# Patient Record
Sex: Male | Born: 1990 | Race: White | Hispanic: No | Marital: Single | Smoking: Current every day smoker
Health system: Southern US, Community
[De-identification: ages and names within clinical notes are randomized; demographics above are authoritative.]

## PROBLEM LIST (undated history)

## (undated) DIAGNOSIS — F329 Major depressive disorder, single episode, unspecified: Secondary | ICD-10-CM

## (undated) DIAGNOSIS — R569 Unspecified convulsions: Secondary | ICD-10-CM

## (undated) DIAGNOSIS — F32A Depression, unspecified: Secondary | ICD-10-CM

## (undated) HISTORY — PX: WISDOM TOOTH EXTRACTION: SHX21

---
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
## 2011-09-14 ENCOUNTER — Encounter: Payer: Self-pay | Admitting: *Deleted

## 2011-09-14 ENCOUNTER — Emergency Department (HOSPITAL_COMMUNITY)
Admission: EM | Admit: 2011-09-14 | Discharge: 2011-09-14 | Disposition: A | Discharge status: Home / Self Care | Payer: PPO | Attending: Emergency Medicine | Admitting: Emergency Medicine

## 2011-09-14 DIAGNOSIS — F3289 Other specified depressive episodes: Secondary | ICD-10-CM | POA: Insufficient documentation

## 2011-09-14 DIAGNOSIS — F329 Major depressive disorder, single episode, unspecified: Secondary | ICD-10-CM | POA: Insufficient documentation

## 2011-09-14 DIAGNOSIS — R45851 Suicidal ideations: Secondary | ICD-10-CM | POA: Insufficient documentation

## 2011-09-14 HISTORY — DX: Major depressive disorder, single episode, unspecified: F32.9

## 2011-09-14 HISTORY — DX: Depression, unspecified: F32.A

## 2011-09-14 LAB — COMPREHENSIVE METABOLIC PANEL
ALT: 11 U/L (ref 0–53)
AST: 21 U/L (ref 0–37)
Albumin: 4.2 g/dL (ref 3.5–5.2)
Alkaline Phosphatase: 146 U/L — ABNORMAL HIGH (ref 39–117)
BUN: 8 mg/dL (ref 6–23)
CO2: 29 mEq/L (ref 19–32)
Calcium: 9.9 mg/dL (ref 8.4–10.5)
Chloride: 102 mEq/L (ref 96–112)
Creatinine, Ser: 1.07 mg/dL (ref 0.50–1.35)
GFR calc Af Amer: >90 mL/min (ref >90)
GFR calc non Af Amer: >90 mL/min (ref >90)
Glucose, Bld: 85 mg/dL (ref 70–99)
Potassium: 3.6 mEq/L (ref 3.5–5.1)
Sodium: 138 mEq/L (ref 135–145)
Total Bilirubin: 0.4 mg/dL (ref 0.3–1.2)
Total Protein: 6.9 g/dL (ref 6.0–8.3)

## 2011-09-14 LAB — CBC
HCT: 42.8 % (ref 39.0–52.0)
Hemoglobin: 14.8 g/dL (ref 13.0–17.0)
MCH: 31.2 pg (ref 26.0–34.0)
MCHC: 34.6 g/dL (ref 30.0–36.0)
MCV: 90.1 fL (ref 78.0–100.0)
Platelets: 250 10*3/uL (ref 150–400)
RBC: 4.75 MIL/uL (ref 4.22–5.81)
RDW: 12.8 % (ref 11.5–15.5)
WBC: 6.1 10*3/uL (ref 4.0–10.5)

## 2011-09-14 LAB — RAPID URINE DRUG SCREEN, HOSP PERFORMED
Amphetamines: NOT DETECTED
Barbiturates: NOT DETECTED
Benzodiazepines: NOT DETECTED
Cocaine: NOT DETECTED
Opiates: NOT DETECTED
Tetrahydrocannabinol: POSITIVE — AB

## 2011-09-14 LAB — ACETAMINOPHEN LEVEL: Acetaminophen (Tylenol), Serum: <15 ug/mL (ref 10–30)

## 2011-09-14 LAB — ETHANOL: Alcohol, Ethyl (B): <11 mg/dL (ref 0–11)

## 2011-09-14 MED ORDER — ONDANSETRON HCL 4 MG PO TABS: 4.0000 mg | ORAL_TABLET | Freq: Three times a day (TID) | ORAL | Status: DC | PRN

## 2011-09-14 MED ORDER — ZIPRASIDONE HCL 20 MG PO CAPS
20.0000 mg | ORAL_CAPSULE | Freq: Once | ORAL | Status: AC
  Administered 2011-09-14: 20 mg via ORAL
  Filled 2011-09-14: qty 1

## 2011-09-14 MED ORDER — NICOTINE 21 MG/24HR TD PT24: 21.0000 mg | MEDICATED_PATCH | Freq: Every day | TRANSDERMAL | Status: DC

## 2011-09-14 MED ORDER — FLUOXETINE HCL 20 MG PO TABS: 30.0000 mg | ORAL_TABLET | Freq: Every day | ORAL | Status: DC

## 2011-09-14 MED ORDER — ACETAMINOPHEN 325 MG PO TABS: 650.0000 mg | ORAL_TABLET | ORAL | Status: DC | PRN

## 2011-09-14 MED ORDER — LORAZEPAM 1 MG PO TABS
1.0000 mg | ORAL_TABLET | Freq: Three times a day (TID) | ORAL | Status: DC | PRN
  Administered 2011-09-14: 1 mg via ORAL
  Filled 2011-09-14: qty 1

## 2011-09-14 NOTE — ED Notes (Signed)
Pt has completed telepsych consult which has recommended discharge home with medication changes. Psychiatrist, Dr. Rob Bunting, has recommended discontinuing Cymbalta, keeping Klonopin at 0.5 BID and starting back Prozac at 30mg  qd. EDP has been notified and is in agreement in decision to d/c. RN made aware. Pt was able to contract for safety and states he will be d/c home to his mother/aunt's house. Pt was given referrals to [facility] and [facility]. Pt agrees to follow up with the psychiatrist appointment scheduled for 09/17/11 with Dr. Clearance Coots and continue seeing his counselor at [facility] No further needs identified at this time.

## 2011-09-14 NOTE — ED Provider Notes (Signed)
History     CSN: [id number] Arrival date & time: 09/14/2011  4:44 PM   First MD Initiated Contact with Patient 09/14/11 1745      Chief Complaint  Patient presents with  . Suicidal    pt reports hx of severe depression, has plan to attempt suicide. pt has appt with psychiatrist on monday but was told to come to ER due to SI. Denies HI.    (Consider location/radiation/quality/duration/timing/severity/associated sxs/prior treatment) HPI Comments: Patient presents with depression and suicidal ideation has been going on for the past few weeks. He takes Prozac and was started on Cymbalta and Klonopin 2 weeks ago by an urgent care. He is not yet seen a psychiatrist but is going to see one on December 3. He says he has multiple plan to kill himself. He denies any homicidal ideation or hallucinations. He states he is not talking alcohol since none is present medications. Denies any somatic complaints.  The history is provided by the patient and a relative.    Past Medical History  Diagnosis Date  . Depression     History reviewed. No pertinent past surgical history.  History reviewed. No pertinent family history.  History  Substance Use Topics  . Smoking status: Never Smoker   . Smokeless tobacco: Not on file  . Alcohol Use: No      Review of Systems  All other systems reviewed and are negative.    Allergies  Review of patient's allergies indicates no known allergies.  Home Medications   Current Outpatient Rx  Name Route Sig Dispense Refill  . CLONAZEPAM 0.5 MG PO TABS Oral Take 0.5 mg by mouth 3 (three) times daily as needed. For anxiety.     . DULOXETINE HCL 30 MG PO CPEP Oral Take 30 mg by mouth 2 (two) times daily.      . IBUPROFEN 200 MG PO TABS Oral Take 600-800 mg by mouth every 8 (eight) hours as needed. For pain.     Marland Kitchen VITAMIN B-12 1000 MCG PO TABS Oral Take 1,000 mcg by mouth daily.      Marland Kitchen FLUOXETINE HCL 20 MG PO TABS Oral Take 1.5 tablets (30 mg total) by  mouth daily. 20 tablet 0    BP 111/66  Pulse 82  Temp(Src) 98.2 F (36.8 C) (Oral)  Resp 20  Wt 150 lb (68.04 kg)  SpO2 98%  Physical Exam  Constitutional: He is oriented to person, place, and time. He appears well-developed and well-nourished. No distress.  HENT:  Head: Normocephalic and atraumatic.  Mouth/Throat: Oropharynx is clear and moist. No oropharyngeal exudate.  Eyes: Conjunctivae are normal. Pupils are equal, round, and reactive to light.  Neck: Normal range of motion.  Cardiovascular: Normal rate, regular rhythm and normal heart sounds.   Pulmonary/Chest: Effort normal and breath sounds normal. No respiratory distress.  Abdominal: Soft. There is no tenderness. There is no rebound and no guarding.  Musculoskeletal: Normal range of motion. He exhibits no edema and no tenderness.  Neurological: He is alert and oriented to person, place, and time. No cranial nerve deficit.  Skin: Skin is warm.  Psychiatric: He is withdrawn. He exhibits a depressed mood.    ED Course  Procedures (including critical care time)  Labs Reviewed  URINE RAPID DRUG SCREEN (HOSP PERFORMED) - Abnormal; Notable for the following:    Tetrahydrocannabinol POSITIVE (*)    All other components within normal limits  COMPREHENSIVE METABOLIC PANEL - Abnormal; Notable for the following:  Alkaline Phosphatase 146 (*)    All other components within normal limits  CBC  ETHANOL  ACETAMINOPHEN LEVEL   No results found.   1. Depression       MDM  Suicidal ideation without plan. Hemodynamically stable without somatic complaints. We'll check screening labs and discuss with act team.  Patient was seen and evaluated by ACT team as well as telepsychiatry.  Found to be depressed but not actively suicidal.  Discontinuation of cymbalta recommended.  Has psychiatrist appointment on Monday.   Mood and affect are much improved on reassessment. Patient states he is no longer suicidal or homicidal.  He  agrees to followup with his psychiatrist as scheduled.  Glynn Octave, MD 09/15/11 [phone]

## 2011-09-14 NOTE — BH Assessment (Addendum)
Assessment Note   Stephen [name] is an 20 y.o. male. Pt reported to the [facility] with a chief complaint of SI with "muliple plans" one in which to [place]. Pt states that he has had increased depression for the past three weeks, triggered by judiciary issues at school and a recent change to his medication. Pt states that drugs were found in his room,"that they were not his" but he had a hearing on campus due to the discovery. Pt reports that he had been taking Prozac prescribed by his [employer] for 3 months and then the medications were recently changed at an urgent care. Pt states he was taken off Prozac and then prescribed Cymbalta and Klonopin which he has taken for the past 3 weeks. Pt reports having increased SI since the medication change as well as an increase sleeping and a decrease in apitite. Pt reports having a decrease in concentration and has been isolating himself from others, not attending class. Pt now states that if he was off the medications he feels the SI and other symptoms would subside. Pt has an outpatient psychiatric appointment scheduled for 09/17/11, though he was concerned that he would not be able to control thoughts of SI if not seen before then. Pt now currently denying any intention to act on SI. Denies HI/AVH. Pt is currently pending a tele-psych for disposition recommendations.   Axis I: Major Depression, single episode Axis II: Deferred Axis III:  Past Medical History  Diagnosis Date  . Depression    Axis IV: educational problems and other psychosocial or environmental problems Axis V: 41-50 serious symptoms  Past Medical History:  Past Medical History  Diagnosis Date  . Depression     History reviewed. No pertinent past surgical history.  Family History: History reviewed. No pertinent family history.  Social History:  reports that he has never smoked. He does not have any smokeless tobacco history on  file. He reports that he uses illicit drugs (Marijuana). He reports that he does not drink alcohol.  Allergies: No Known Allergies  Home Medications:  Medications Prior to Admission  Medication Dose Route Frequency Provider Last Rate Last Dose  . acetaminophen (TYLENOL) tablet 650 mg  650 mg Oral Q4H PRN Glynn Octave, MD      . LORazepam (ATIVAN) tablet 1 mg  1 mg Oral Q8H PRN Glynn Octave, MD   1 mg at 09/14/11 1911  . nicotine (NICODERM CQ - dosed in mg/24 hours) patch 21 mg  21 mg Transdermal Daily Glynn Octave, MD      . ondansetron [facility]) tablet 4 mg  4 mg Oral Q8H PRN Glynn Octave, MD      . ziprasidone (GEODON) capsule 20 mg  20 mg Oral Once Glynn Octave, MD   20 mg at 09/14/11 1947   No current outpatient prescriptions on file as of 09/14/2011.    OB/GYN Status:  No LMP for male patient.  General Assessment Data Assessment Number: 1  Living Arrangements: Friends ([place] college [place] with a roommate) Can pt return to current living arrangement?: Yes Admission Status: Voluntary Is patient capable of signing voluntary admission?: Yes Transfer from: Acute Hospital Referral Source: Self/Family/Friend  Risk to self Suicidal Ideation: Yes-Currently Present (Pt reports SI gestures at age 49) Suicidal Intent: No Is patient at risk for suicide?: No Suicidal Plan?: No Access to Means: No What has been your use of drugs/alcohol within the  last 12 months?: Marijuana- 1/8 oz weekly for years Other Self Harm Risks: none Triggers for Past Attempts: Other (Comment) (depression has triggered SI in past) Intentional Self Injurious Behavior: None Factors that decrease suicide risk: Other deterrents (comment) (Pt states THC is his only deterrent) Family Suicide History: No Recent stressful life event(s): Legal Issues;Other (Comment) (legal issue at school related to drugs found in dorm room) Persecutory voices/beliefs?: No Depression: Yes Depression  Symptoms: Insomnia;Tearfulness;Isolating;Loss of interest in usual pleasures;Feeling worthless/self pity Substance abuse history and/or treatment for substance abuse?: Yes Suicide prevention information given to non-admitted patients: Yes  Risk to Others Homicidal Ideation: No Thoughts of Harm to Others: No Current Homicidal Intent: No Current Homicidal Plan: No Access to Homicidal Means: No History of harm to others?: No Assessment of Violence: None Noted Does patient have access to weapons?: No Criminal Charges Pending?: No Does patient have a court date: No  Mental Status Report Appear/Hygiene: Disheveled Eye Contact: Fair Motor Activity: Unremarkable Speech: Logical/coherent Level of Consciousness: Drowsy Mood: Depressed;Anxious Affect: Depressed Anxiety Level: Minimal Thought Processes: Relevant;Coherent Judgement: Impaired Orientation: Person;Place;Time;Situation Obsessive Compulsive Thoughts/Behaviors: None  Cognitive Functioning Concentration: Decreased Memory: Recent Intact;Remote Intact IQ: Average Insight: Fair Impulse Control: Fair Appetite: Poor Weight Loss: 10  (7-10 lbs lost in 3 weeks) Sleep: Increased Total Hours of Sleep: 20  (pt reports sleeping 20 hrs daily since starting new meds. ) Vegetative Symptoms: None  Prior Inpatient/Outpatient Therapy Prior Therapy: Outpatient Prior Therapy Dates: age 63 Prior Therapy Facilty/Provider(s): [facility] Reason for Treatment: severe depression            Values / Beliefs Cultural Requests During Hospitalization: None Spiritual Requests During Hospitalization: None        Additional Information 1:1 In Past 12 Months?: No CIRT Risk: No Elopement Risk: No Does patient have medical clearance?: Yes     Disposition:  Disposition Disposition of Patient: Other dispositions (Pt is currently pending a telepsych consult for disposition ) Other disposition(s): Other (Comment) (pending  telepsych)  Pt has completed telepsych consult which has recommended discharge home with medication changes. Psychiatrist, Dr. Rob Bunting, has recommended discontinuing Cymbalta, keeping Klonopin at 0.5 BID and starting back Prozac at 30mg  qd. EDP has been notified and is in agreement in decision to d/c. RN made aware. Pt was able to contract for safety and states he will be d/c home to his mother/aunt's house. Pt was given referrals to [facility] and [facility]. Pt agrees to follow up with the psychiatrist appointment scheduled for 09/17/11 with Dr. Clearance Coots. No further needs identified at this time.   On Site Evaluation by:   Reviewed with Physician:     [place] Crane F 09/14/2011 8:19 PM

## 2011-11-13 ENCOUNTER — Encounter (HOSPITAL_COMMUNITY): Payer: Self-pay | Admitting: Emergency Medicine

## 2011-11-13 ENCOUNTER — Emergency Department (HOSPITAL_COMMUNITY): Payer: Self-pay

## 2011-11-13 ENCOUNTER — Emergency Department (HOSPITAL_COMMUNITY)
Admission: EM | Admit: 2011-11-13 | Discharge: 2011-11-13 | Disposition: A | Discharge status: Home / Self Care | Payer: Self-pay | Attending: Emergency Medicine | Admitting: Emergency Medicine

## 2011-11-13 DIAGNOSIS — N509 Disorder of male genital organs, unspecified: Secondary | ICD-10-CM | POA: Insufficient documentation

## 2011-11-13 DIAGNOSIS — N50811 Right testicular pain: Secondary | ICD-10-CM

## 2011-11-13 LAB — URINALYSIS, ROUTINE W REFLEX MICROSCOPIC
Glucose, UA: NEGATIVE mg/dL
Ketones, ur: NEGATIVE mg/dL
Leukocytes, UA: NEGATIVE
Nitrite: NEGATIVE
Specific Gravity, Urine: 1.031 — ABNORMAL HIGH (ref 1.005–1.030)
pH: 5.5 (ref 5.0–8.0)

## 2011-11-13 LAB — URINE MICROSCOPIC-ADD ON

## 2011-11-13 MED ORDER — OXYCODONE-ACETAMINOPHEN 5-325 MG PO TABS
1.0000 | ORAL_TABLET | Freq: Once | ORAL | Status: AC
  Administered 2011-11-13: 1 via ORAL
  Filled 2011-11-13: qty 1

## 2011-11-13 MED ORDER — IBUPROFEN 600 MG PO TABS: 600.0000 mg | ORAL_TABLET | Freq: Four times a day (QID) | ORAL | Status: AC | PRN

## 2011-11-13 MED ORDER — OXYCODONE-ACETAMINOPHEN 5-325 MG PO TABS: 1.0000 | ORAL_TABLET | ORAL | Status: AC | PRN

## 2011-11-13 NOTE — ED Provider Notes (Signed)
History     CSN: [id number]  Arrival date & time 11/13/11  4098   First MD Initiated Contact with Patient 11/13/11 0350      Chief Complaint  Patient presents with  . Testicle Pain    (Consider location/radiation/quality/duration/timing/severity/associated sxs/prior treatment) Patient is a 21 y.o. male presenting with testicular pain. The history is provided by the patient.  Testicle Pain This is a new problem. The current episode started more than 2 days ago. The problem has been gradually worsening. Pertinent negatives include no chest pain, no abdominal pain, no headaches and no shortness of breath. The symptoms are aggravated by nothing. The symptoms are relieved by nothing.   Pt has had several days of intermittent R testicular pain worse tonight. Pt denies fever, chill, N, V, penile d/c or recent illness. Pt states he is a virgin.  Past Medical History  Diagnosis Date  . Depression     Past Surgical History  Procedure Date  . Wisdom tooth extraction     History reviewed. No pertinent family history.  History  Substance Use Topics  . Smoking status: Never Smoker   . Smokeless tobacco: Not on file  . Alcohol Use: No      Review of Systems  Constitutional: Negative for fever and chills.  Respiratory: Negative for shortness of breath.   Cardiovascular: Negative for chest pain.  Gastrointestinal: Negative for nausea, vomiting, abdominal pain and diarrhea.  Genitourinary: Positive for testicular pain. Negative for dysuria, discharge, penile swelling, scrotal swelling and penile pain.  Skin: Negative for color change, pallor and rash.  Neurological: Negative for headaches.    Allergies  Review of patient's allergies indicates no known allergies.  Home Medications   Current Outpatient Rx  Name Route Sig Dispense Refill  . IBUPROFEN 200 MG PO TABS Oral Take 600-800 mg by mouth every 8 (eight) hours as needed. For pain.     Marland Kitchen VITAMIN B-12 1000 MCG PO TABS Oral  Take 1,000 mcg by mouth daily.      . IBUPROFEN 600 MG PO TABS Oral Take 1 tablet (600 mg total) by mouth every 6 (six) hours as needed for pain. 30 tablet 0  . OXYCODONE-ACETAMINOPHEN 5-325 MG PO TABS Oral Take 1 tablet by mouth every 4 (four) hours as needed for pain. 15 tablet 0    BP 118/69  Pulse 92  Temp(Src) 98.2 F (36.8 C) (Oral)  Resp 14  SpO2 100%  Physical Exam  Nursing note and vitals reviewed. Constitutional: He is oriented to person, place, and time. He appears well-developed and well-nourished. No distress.  HENT:  Head: Normocephalic and atraumatic.  Mouth/Throat: Oropharynx is clear and moist.  Eyes: EOM are normal. Pupils are equal, round, and reactive to light.  Neck: Normal range of motion. Neck supple.  Cardiovascular: Normal rate and regular rhythm.   Pulmonary/Chest: Effort normal and breath sounds normal. No respiratory distress. He has no wheezes. He has no rales.  Abdominal: Soft. Bowel sounds are normal. There is no tenderness. There is no rebound and no guarding. Hernia confirmed negative in the right inguinal area and confirmed negative in the left inguinal area.  Genitourinary: Testes normal and penis normal. Cremasteric reflex is present. Right testis shows no mass, no swelling and no tenderness. Left testis shows no mass, no swelling and no tenderness. Uncircumcised. No penile erythema or penile tenderness. No discharge found.  Musculoskeletal: Normal range of motion. He exhibits no edema and no tenderness.  Neurological: He is alert and  oriented to person, place, and time.  Skin: Skin is warm and dry. No rash noted. No erythema.  Psychiatric: He has a normal mood and affect. His behavior is normal.    ED Course  Procedures (including critical care time)  Labs Reviewed  URINALYSIS, ROUTINE W REFLEX MICROSCOPIC - Abnormal; Notable for the following:    Specific Gravity, Urine 1.031 (*)    Hgb urine dipstick TRACE (*)    All other components within  normal limits  URINE MICROSCOPIC-ADD ON - Abnormal; Notable for the following:    Bacteria, UA FEW (*)    All other components within normal limits   US Scrotum  11/13/2011  *RADIOLOGY REPORT*  Clinical Data: Right testicular pain for 1 week.  No fever, trauma, or leukocytosis.  SCROTAL ULTRASOUND DOPPLER ULTRASOUND OF THE TESTICLES  Technique:  Complete ultrasound examination of the testicles, epididymis, and other scrotal structures was performed.  Color and spectral Doppler ultrasound were also utilized to evaluate blood flow to the testicles.  Comparison:  None.  Findings:  The testicles are symmetric in size and echogenicity. The right testis measures 3.9 x 1.5 x 3.4 cm.  The left testis measures 3.8 x 2.5 x 2.9 cm. No testicular masses are seen, and there is no evidence of microlithiasis.  Both epididymal heads are unremarkable in appearance.  There is no evidence of hydrocele, varicocele, or other extra-testicular abnormality.  Blood flow is seen within both testicles on color Doppler sonography.  Flow within the testicles appears mildly prominent but is symmetrical bilaterally, likely within normal limits. Doppler spectral waveforms show both arterial and venous flow signal in both testicles.  IMPRESSION: Symmetrical appearance of the testicles.  No evidence of testicular mass or torsion.  Original Report Authenticated By: Marlon Pel, M.D.   [place] Art/ven Flow Abd Pelv Doppler  11/13/2011  *RADIOLOGY REPORT*  Clinical Data: Right testicular pain for 1 week.  No fever, trauma, or leukocytosis.  SCROTAL ULTRASOUND DOPPLER ULTRASOUND OF THE TESTICLES  Technique:  Complete ultrasound examination of the testicles, epididymis, and other scrotal structures was performed.  Color and spectral Doppler ultrasound were also utilized to evaluate blood flow to the testicles.  Comparison:  None.  Findings:  The testicles are symmetric in size and echogenicity. The right testis measures 3.9 x 1.5 x 3.4 cm.   The left testis measures 3.8 x 2.5 x 2.9 cm. No testicular masses are seen, and there is no evidence of microlithiasis.  Both epididymal heads are unremarkable in appearance.  There is no evidence of hydrocele, varicocele, or other extra-testicular abnormality.  Blood flow is seen within both testicles on color Doppler sonography.  Flow within the testicles appears mildly prominent but is symmetrical bilaterally, likely within normal limits. Doppler spectral waveforms show both arterial and venous flow signal in both testicles.  IMPRESSION: Symmetrical appearance of the testicles.  No evidence of testicular mass or torsion.  Original Report Authenticated By: Marlon Pel, M.D.     1. Testicular pain, right       MDM    Will get [place] to r/o torsion. Re-eval.     Pain is improved. Pt resting comfortably. Informed of results and need to f/u with urology if symptoms persist. Pt voiced understanding.   Loren Racer, MD 11/13/11 0630

## 2011-11-13 NOTE — ED Notes (Signed)
Pt to US.

## 2011-11-13 NOTE — ED Notes (Signed)
Pt reports having (R) testicular pain that started last week.  Denies injury, denies lumps, denies swelling or redness.  Denies penile discharge or discomfort.  Pain is intermittent.

## 2011-11-13 NOTE — ED Notes (Signed)
PT. REPORTS RIGHT TESTICULAR PAIN ONSET LAST WEEK , DENIES INJURY , NO DYSURIA , DENIES RECENT SEXUAL ENCOUNTER.

## 2011-11-13 NOTE — ED Notes (Signed)
Pt returned from US

## 2018-09-16 ENCOUNTER — Other Ambulatory Visit: Payer: Self-pay

## 2018-09-16 ENCOUNTER — Emergency Department (HOSPITAL_COMMUNITY): Payer: Self-pay

## 2018-09-16 ENCOUNTER — Encounter (HOSPITAL_COMMUNITY): Payer: Self-pay

## 2018-09-16 ENCOUNTER — Ambulatory Visit (HOSPITAL_COMMUNITY)
Admission: EM | Admit: 2018-09-16 | Discharge: 2018-09-16 | Disposition: A | Discharge status: Home / Self Care | Payer: PPO

## 2018-09-16 ENCOUNTER — Emergency Department (HOSPITAL_COMMUNITY)
Admission: EM | Admit: 2018-09-16 | Discharge: 2018-09-16 | Disposition: A | Discharge status: Home / Self Care | Payer: Self-pay | Attending: Emergency Medicine | Admitting: Emergency Medicine

## 2018-09-16 DIAGNOSIS — F17200 Nicotine dependence, unspecified, uncomplicated: Secondary | ICD-10-CM | POA: Insufficient documentation

## 2018-09-16 DIAGNOSIS — R1031 Right lower quadrant pain: Secondary | ICD-10-CM | POA: Insufficient documentation

## 2018-09-16 DIAGNOSIS — R109 Unspecified abdominal pain: Secondary | ICD-10-CM

## 2018-09-16 LAB — CBC WITH DIFFERENTIAL/PLATELET
Abs Immature Granulocytes: 0.04 10*3/uL (ref 0.00–0.07)
BASOS ABS: 0.1 10*3/uL (ref 0.0–0.1)
Basophils Relative: 1 %
EOS PCT: 1 %
Eosinophils Absolute: 0.1 10*3/uL (ref 0.0–0.5)
HEMATOCRIT: 49.9 % (ref 39.0–52.0)
HEMOGLOBIN: 16.8 g/dL (ref 13.0–17.0)
IMMATURE GRANULOCYTES: 0 %
LYMPHS ABS: 3.5 10*3/uL (ref 0.7–4.0)
LYMPHS PCT: 36 %
MCH: 31.6 pg (ref 26.0–34.0)
MCHC: 33.7 g/dL (ref 30.0–36.0)
MCV: 93.8 fL (ref 80.0–100.0)
MONOS PCT: 8 %
Monocytes Absolute: 0.8 10*3/uL (ref 0.1–1.0)
Neutro Abs: 5.4 10*3/uL (ref 1.7–7.7)
Neutrophils Relative %: 54 %
Platelets: 391 10*3/uL (ref 150–400)
RBC: 5.32 MIL/uL (ref 4.22–5.81)
RDW: 11.9 % (ref 11.5–15.5)
WBC: 9.9 10*3/uL (ref 4.0–10.5)
nRBC: 0 % (ref 0.0–0.2)

## 2018-09-16 LAB — COMPREHENSIVE METABOLIC PANEL
ALBUMIN: 4.6 g/dL (ref 3.5–5.0)
ALK PHOS: 93 U/L (ref 38–126)
ALT: 26 U/L (ref 0–44)
ANION GAP: 10 (ref 5–15)
AST: 36 U/L (ref 15–41)
BUN: 10 mg/dL (ref 6–20)
CALCIUM: 9.8 mg/dL (ref 8.9–10.3)
CO2: 24 mmol/L (ref 22–32)
Chloride: 104 mmol/L (ref 98–111)
Creatinine, Ser: 0.93 mg/dL (ref 0.61–1.24)
GFR calc Af Amer: >60 mL/min (ref >60)
GFR calc non Af Amer: >60 mL/min (ref >60)
Glucose, Bld: 79 mg/dL (ref 70–99)
POTASSIUM: 3.8 mmol/L (ref 3.5–5.1)
Sodium: 138 mmol/L (ref 135–145)
Total Bilirubin: 0.8 mg/dL (ref 0.3–1.2)
Total Protein: 7.4 g/dL (ref 6.5–8.1)

## 2018-09-16 LAB — LIPASE, BLOOD: Lipase: 48 U/L (ref 11–51)

## 2018-09-16 LAB — URINALYSIS, ROUTINE W REFLEX MICROSCOPIC
Bacteria, UA: NONE SEEN
Bilirubin Urine: NEGATIVE
GLUCOSE, UA: NEGATIVE mg/dL
Ketones, ur: 5 mg/dL — AB
LEUKOCYTES UA: NEGATIVE
NITRITE: NEGATIVE
PH: 6 (ref 5.0–8.0)
Protein, ur: NEGATIVE mg/dL
SPECIFIC GRAVITY, URINE: 1.02 (ref 1.005–1.030)

## 2018-09-16 MED ORDER — ONDANSETRON HCL 4 MG/2ML IJ SOLN
4.0000 mg | Freq: Once | INTRAMUSCULAR | Status: AC
  Administered 2018-09-16: 4 mg via INTRAVENOUS
  Filled 2018-09-16: qty 2

## 2018-09-16 MED ORDER — SODIUM CHLORIDE 0.9 % IV BOLUS
1000.0000 mL | Freq: Once | INTRAVENOUS | Status: AC
  Administered 2018-09-16: 1000 mL via INTRAVENOUS

## 2018-09-16 MED ORDER — IOHEXOL 300 MG/ML  SOLN
100.0000 mL | Freq: Once | INTRAMUSCULAR | Status: AC | PRN
  Administered 2018-09-16: 100 mL via INTRAVENOUS

## 2018-09-16 MED ORDER — MORPHINE SULFATE (PF) 4 MG/ML IV SOLN
4.0000 mg | Freq: Once | INTRAVENOUS | Status: AC
  Administered 2018-09-16: 4 mg via INTRAVENOUS
  Filled 2018-09-16: qty 1

## 2018-09-16 MED ORDER — DICYCLOMINE HCL 20 MG PO TABS: 20.0000 mg | ORAL_TABLET | Freq: Three times a day (TID) | ORAL | 0 refills | Status: DC | PRN

## 2018-09-16 NOTE — ED Notes (Signed)
Pt ambulated into triage room holding his R lower side, c/o R lower abdominal pain, pt appears to be in extreme discomfort, pt mildly diaphoretic. Per Dr. Sheryle Hailiamond, pt needs eval in ER. Pt agreeable to plan, girlfriend took patient to the ER.

## 2018-09-16 NOTE — ED Triage Notes (Signed)
Pt from home w/ a c/o intermittent RUQ abdominal pain for 1 week and constant pain for the past couple of days. The pain is sharp in nature and increases w/ lifting and straining. Non-radiating. Abd is tender to palpation. No N/V/D.

## 2018-09-16 NOTE — ED Triage Notes (Signed)
Pt here from urgent care with complaint of abdominal pain. Pt appears to be in in severe pain. Tachycardic on arrival. Pt is afebrile.

## 2018-09-16 NOTE — ED Provider Notes (Signed)
[facility] EMERGENCY DEPARTMENT Provider Note   CSN: [id number] Arrival date & time: 09/16/18  1444     History   Chief Complaint Chief Complaint  Patient presents with  . Abdominal Pain    HPI Stephen [name] is a 27 y.o. male.  Intermittent pain in the right anterior mid abdomen for 1 week.  Pain is described as sharp and worse with lifting and straining.  No radiation of pain.  No previous surgery.  No chronic health problems.  He has taken nothing at home for the pain.  Severity is moderate.     Past Medical History:  Diagnosis Date  . Depression     There are no active problems to display for this patient.   Past Surgical History:  Procedure Laterality Date  . WISDOM TOOTH EXTRACTION          Home Medications    Prior to Admission medications   Medication Sig Start Date End Date Taking? Authorizing Provider  dicyclomine (BENTYL) 20 MG tablet Take 1 tablet (20 mg total) by mouth 3 (three) times daily as needed for spasms. 09/16/18   Donnetta Hutching, MD    Family History History reviewed. No pertinent family history.  Social History Social History   Tobacco Use  . Smoking status: Current Every Day Smoker    Packs/day: 2.00  . Smokeless tobacco: Never Used  Substance Use Topics  . Alcohol use: Yes    Alcohol/week: 14.0 standard drinks    Types: 14 Cans of beer per week    Comment: 2/night  . Drug use: Yes    Types: Marijuana     Allergies   Patient has no known allergies.   Review of Systems Review of Systems  All other systems reviewed and are negative.    Physical Exam Updated Vital Signs BP 128/78   Pulse 89   Temp 98.1 F (36.7 C) (Oral)   Resp 16   Ht 5\' 10"  (1.778 m)   Wt 65.8 kg   SpO2 100%   BMI 20.81 kg/m   Physical Exam  Constitutional: He is oriented to person, place, and time. He appears well-developed and well-nourished.  HENT:  Head: Normocephalic and atraumatic.  Eyes: Conjunctivae are normal.    Neck: Neck supple.  Cardiovascular: Normal rate and regular rhythm.  Pulmonary/Chest: Effort normal and breath sounds normal.  Abdominal: Soft. Bowel sounds are normal.  Minimal tenderness right mid anterior abdomen  Musculoskeletal: Normal range of motion.  Neurological: He is alert and oriented to person, place, and time.  Skin: Skin is warm and dry.  Psychiatric: He has a normal mood and affect. His behavior is normal.  Nursing note and vitals reviewed.    ED Treatments / Results  Labs (all labs ordered are listed, but only abnormal results are displayed) Labs Reviewed  URINALYSIS, ROUTINE W REFLEX MICROSCOPIC - Abnormal; Notable for the following components:      Result Value   Hgb urine dipstick MODERATE (*)    Ketones, ur 5 (*)    All other components within normal limits  CBC WITH DIFFERENTIAL/PLATELET  COMPREHENSIVE METABOLIC PANEL  LIPASE, BLOOD    EKG None  Radiology Ct Abdomen Pelvis W Contrast  Result Date: 09/16/2018 CLINICAL DATA:  27 year old male with RIGHT abdominal and pelvic pain for 1 week. EXAM: CT ABDOMEN AND PELVIS WITH CONTRAST TECHNIQUE: Multidetector CT imaging of the abdomen and pelvis was performed using the standard protocol following bolus administration of intravenous contrast. CONTRAST:  100mL OMNIPAQUE IOHEXOL 300 MG/ML  SOLN COMPARISON:  08/21/2010 CT FINDINGS: Lower chest: No acute abnormality. Hepatobiliary: The liver and gallbladder are unremarkable. No biliary dilatation. Pancreas: Unremarkable Spleen: Unremarkable Adrenals/Urinary Tract: The kidneys, adrenal glands and bladder are unremarkable. Stomach/Bowel: Stomach is within normal limits. Appendix appears normal. No evidence of bowel wall thickening, distention, or inflammatory changes. Vascular/Lymphatic: No significant vascular findings are present. No enlarged abdominal or pelvic lymph nodes. Reproductive: Prostate is unremarkable. Other: No ascites, focal collection, pneumoperitoneum or  abdominal wall hernia. Musculoskeletal: No acute or significant osseous findings. IMPRESSION: No acute or significant abnormalities. Electronically Signed   By: Harmon PierJeffrey  Hu M.D.   On: 09/16/2018 18:15    Procedures Procedures (including critical care time)  Medications Ordered in ED Medications  sodium chloride 0.9 % bolus 1,000 mL (0 mLs Intravenous Stopped 09/16/18 1814)  ondansetron (ZOFRAN) injection 4 mg (4 mg Intravenous Given 09/16/18 1710)  morphine 4 MG/ML injection 4 mg (4 mg Intravenous Given 09/16/18 1710)  iohexol (OMNIPAQUE) 300 MG/ML solution 100 mL (100 mLs Intravenous Contrast Given 09/16/18 1737)     Initial Impression / Assessment and Plan / ED Course  I have reviewed the triage vital signs and the nursing notes.  Pertinent labs & imaging results that were available during my care of the patient were reviewed by me and considered in my medical decision making (see chart for details).     Patient presents with right-sided abdominal pain.  He has no chronic health problems or previous surgery.  Screening labs and CT abdomen pelvis show no acute findings.  There is some hemoglobin in the urine.  No clinical evidence of appendicitis.  Findings were discussed with the patient.  Discharge medication Bentyl 20 mg.  Final Clinical Impressions(s) / ED Diagnoses   Final diagnoses:  Abdominal pain, unspecified abdominal location    ED Discharge Orders         Ordered    dicyclomine (BENTYL) 20 MG tablet  3 times daily PRN     09/16/18 1933           Donnetta Hutchingook, Ramiah Helfrich, MD 09/16/18 2251

## 2018-09-16 NOTE — ED Notes (Signed)
ED Provider at bedside. 

## 2018-09-16 NOTE — ED Notes (Signed)
Discharge paperwork explained and reviewed. Pt verbalized understanding of all paperwork and acknowledged consent. Unable to sign discharge paperwork.

## 2018-09-16 NOTE — Discharge Instructions (Addendum)
Tests showed no life-threatening condition.  You do have a small amount of blood in the urine.  This can be rechecked at a later date.  Prescription for abdominal cramping.

## 2019-08-20 ENCOUNTER — Emergency Department (HOSPITAL_COMMUNITY)
Admission: EM | Admit: 2019-08-20 | Discharge: 2019-08-20 | Discharge status: Against Medical Advice | Payer: Self-pay | Attending: Emergency Medicine | Admitting: Emergency Medicine

## 2019-08-20 DIAGNOSIS — Z5321 Procedure and treatment not carried out due to patient leaving prior to being seen by health care provider: Secondary | ICD-10-CM | POA: Insufficient documentation

## 2019-08-20 NOTE — ED Triage Notes (Signed)
Pt here with c/o cough and thinks he was running a fever and did test positive  For covid  18 days ago but was cleared to go back to work

## 2019-08-20 NOTE — ED Notes (Signed)
2x calling for pt. No response.

## 2019-08-20 NOTE — ED Notes (Signed)
Called pt to go back to exam room. No response.  

## 2020-04-25 ENCOUNTER — Ambulatory Visit (INDEPENDENT_AMBULATORY_CARE_PROVIDER_SITE_OTHER): Payer: Self-pay

## 2020-04-25 ENCOUNTER — Other Ambulatory Visit: Payer: Self-pay

## 2020-04-25 ENCOUNTER — Encounter (HOSPITAL_COMMUNITY): Payer: Self-pay

## 2020-04-25 ENCOUNTER — Ambulatory Visit (HOSPITAL_COMMUNITY)
Admission: EM | Admit: 2020-04-25 | Discharge: 2020-04-25 | Disposition: A | Discharge status: Home / Self Care | Payer: Self-pay | Attending: Family Medicine | Admitting: Family Medicine

## 2020-04-25 DIAGNOSIS — S62364A Nondisplaced fracture of neck of fourth metacarpal bone, right hand, initial encounter for closed fracture: Secondary | ICD-10-CM

## 2020-04-25 DIAGNOSIS — M79641 Pain in right hand: Secondary | ICD-10-CM

## 2020-04-25 MED ORDER — IBUPROFEN 600 MG PO TABS: 600.0000 mg | ORAL_TABLET | Freq: Four times a day (QID) | ORAL | 0 refills | Status: DC | PRN

## 2020-04-25 NOTE — ED Triage Notes (Signed)
Pt presents with right hand injury after hitting against a chair 2 days ago; pt has significant swelling and bruising.

## 2020-04-25 NOTE — ED Notes (Signed)
Pt refused to be splint by ortho and left without paperwork.

## 2020-04-25 NOTE — Progress Notes (Signed)
Orthopedic Tech Progress Note Patient Details:  Stephen [name] Aug 09, 1991 [id number]  Ortho Devices Type of Ortho Device: Ulna gutter splint Ortho Device/Splint Location: Upper Right Extremity   Post Interventions Patient Tolerated: Refused intervention  I attempted to apply the splint to patient, but he did not want the splint. He said he came to get his xray and to confirm whether or not there was a break. He claimed he was able to function normally, so he didn't feel it was necessary for me to apply it. Patient also said that he will follow up with specialist as directed.  Gerald Stabs 04/25/2020, 3:26 PM

## 2020-04-25 NOTE — Discharge Instructions (Signed)
Wear the splint at all times  You need to follow up with the hand specialist attached, call [facility] number given  Ibuprofen for pain

## 2020-04-25 NOTE — ED Provider Notes (Addendum)
[facility]    CSN: [id number] Arrival date & time: 04/25/20  1211      History   Chief Complaint Chief Complaint  Patient presents with  . Hand Injury    HPI Stephen [name] is a 29 y.o. male.   Patient reports for evaluation of right hand pain.  He reports 2 days ago he was swinging the hand back to grab something and hit it on a wooden chair.  He reports a lot of pain at the fourth knuckle.  He has had bruising and swelling.  He reports he can move the fingers and make a fist without much issue.  Denies numbness or tingling.  Reports he is concerned about dislocation or breaking the knuckle.  No previous injuries.     Past Medical History:  Diagnosis Date  . Depression     There are no problems to display for this patient.   Past Surgical History:  Procedure Laterality Date  . WISDOM TOOTH EXTRACTION         Home Medications    Prior to Admission medications   Medication Sig Start Date End Date Taking? Authorizing Provider  dicyclomine (BENTYL) 20 MG tablet Take 1 tablet (20 mg total) by mouth 3 (three) times daily as needed for spasms. 09/16/18   Donnetta Hutching, MD  ibuprofen (ADVIL) 600 MG tablet Take 1 tablet (600 mg total) by mouth every 6 (six) hours as needed. 04/25/20   Amaani Guilbault, Veryl Speak, PA-C    Family History History reviewed. No pertinent family history.  Social History Social History   Tobacco Use  . Smoking status: Current Every Day Smoker    Packs/day: 2.00  . Smokeless tobacco: Never Used  Vaping Use  . Vaping Use: Never used  Substance Use Topics  . Alcohol use: Yes    Alcohol/week: 14.0 standard drinks    Types: 14 Cans of beer per week    Comment: 2/night  . Drug use: Yes    Types: Marijuana     Allergies   Patient has no known allergies.   Review of Systems Review of Systems   Physical Exam Triage Vital Signs ED Triage Vitals  Enc Vitals Group     BP 04/25/20 1332 123/87     Pulse Rate 04/25/20 1332 77     Resp  04/25/20 1332 18     Temp 04/25/20 1332 98.2 F (36.8 C)     Temp Source 04/25/20 1332 Oral     SpO2 04/25/20 1332 98 %     Weight --      Height --      Head Circumference --      Peak Flow --      Pain Score 04/25/20 1333 8     Pain Loc --      Pain Edu? --      Excl. in GC? --    No data found.  Updated Vital Signs BP 123/87 (BP Location: Right Arm)   Pulse 77   Temp 98.2 F (36.8 C) (Oral)   Resp 18   SpO2 98%   Visual Acuity Right Eye Distance:   Left Eye Distance:   Bilateral Distance:    Right Eye Near:   Left Eye Near:    Bilateral Near:     Physical Exam Vitals and nursing note reviewed.  Constitutional:      Appearance: Normal appearance.  Musculoskeletal:     Comments: Right hand with ecchymosis across the dorsum.  There is tenderness over the fourth MCP joint.  Patient able to fully extend fingers and make a fist.  No metacarpal tenderness or deformity.  Cap refill less than 2 seconds sensation intact.  Neurological:     Mental Status: He is alert.      UC Treatments / Results  Labs (all labs ordered are listed, but only abnormal results are displayed) Labs Reviewed - No data to display  EKG   Radiology DG Hand Complete Right  Result Date: 04/25/2020 CLINICAL DATA:  Pain after hitting solid object EXAM: RIGHT HAND - COMPLETE 3+ VIEW COMPARISON:  None. FINDINGS: Frontal, oblique, and lateral views were obtained. There is a fracture of the distal aspect of the fourth metacarpal with impaction at the fracture site. There is volar angulation distally in this area. No other fracture. No dislocation. Joint spaces appear normal. No erosive change. IMPRESSION: Fracture distal fourth metacarpal with impaction at fracture site and volar angulation distally. No other fracture. No dislocation. No arthropathy evident. These results will be called to the ordering clinician or representative by the Radiologist Assistant, and communication documented in the PACS or  [employer]. Electronically Signed   By: Bretta Bang III M.D.   On: 04/25/2020 14:13    Procedures Procedures (including critical care time)  Medications Ordered in UC Medications - No data to display  Initial Impression / Assessment and Plan / UC Course  I have reviewed the triage vital signs and the nursing notes.  Pertinent labs & imaging results that were available during my care of the patient were reviewed by me and considered in my medical decision making (see chart for details).     #Closed nondisplaced fracture of the neck of the fourth metacarpal on the right hand Patient is a 29 year old with fracture of the fourth metacarpal.  Neurovascularly intact.  Will place in ulnar gutter in [place] (90 degree flexion of the MCPs) and referred to hand.  Information given.  Discussed the importance of following up with the patient, he had concerns over insurance issues.  Patient is right-hand dominant and reiterated that it is very important for him to be followed up to ensure proper healing.  Patient agrees to follow-up with [facility] as there is on call for hand.  Patient verbalized understanding plan of care.   ADDENDUM: Patient refused splinting from [employer] team, patient had been given instructions and discharge information by provider but refused splint when [employer] team was applying. Provider had discussed extensively the importance of following up for his hand specialist. Patient left prior to having any further discussion with this provider.Patient was of stable mind. Patient discharge status will be changed to AMA as had been advised to be splinted.  Final Clinical Impressions(s) / UC Diagnoses   Final diagnoses:  Closed nondisplaced fracture of neck of fourth metacarpal bone of right hand, initial encounter     Discharge Instructions     Wear the splint at all times  You need to follow up with the hand specialist attached, call [facility] number  given  Ibuprofen for pain      ED Prescriptions    Medication Sig Dispense Auth. Provider   ibuprofen (ADVIL) 600 MG tablet Take 1 tablet (600 mg total) by mouth every 6 (six) hours as needed. 30 tablet Mekia Dipinto, Veryl Speak, PA-C     PDMP not reviewed this encounter.   Hermelinda Medicus, PA-C 04/25/20 1451    Siniyah Evangelist, Veryl Speak, PA-C 04/25/20 1524    Damaree Sargent,  Veryl Speak, PA-C 04/25/20 1524

## 2022-02-09 ENCOUNTER — Other Ambulatory Visit: Payer: Self-pay

## 2022-02-09 ENCOUNTER — Emergency Department (HOSPITAL_COMMUNITY): Payer: Self-pay

## 2022-02-09 ENCOUNTER — Emergency Department (HOSPITAL_COMMUNITY)
Admission: EM | Admit: 2022-02-09 | Discharge: 2022-02-10 | Disposition: A | Discharge status: Home / Self Care | Payer: Self-pay | Attending: Emergency Medicine | Admitting: Emergency Medicine

## 2022-02-09 ENCOUNTER — Encounter (HOSPITAL_COMMUNITY): Payer: Self-pay | Admitting: Emergency Medicine

## 2022-02-09 DIAGNOSIS — R000 Tachycardia, unspecified: Secondary | ICD-10-CM | POA: Insufficient documentation

## 2022-02-09 DIAGNOSIS — D72829 Elevated white blood cell count, unspecified: Secondary | ICD-10-CM | POA: Insufficient documentation

## 2022-02-09 DIAGNOSIS — E860 Dehydration: Secondary | ICD-10-CM | POA: Insufficient documentation

## 2022-02-09 DIAGNOSIS — N179 Acute kidney failure, unspecified: Secondary | ICD-10-CM | POA: Insufficient documentation

## 2022-02-09 DIAGNOSIS — R112 Nausea with vomiting, unspecified: Secondary | ICD-10-CM

## 2022-02-09 DIAGNOSIS — F17200 Nicotine dependence, unspecified, uncomplicated: Secondary | ICD-10-CM | POA: Insufficient documentation

## 2022-02-09 LAB — CBC WITH DIFFERENTIAL/PLATELET
Abs Immature Granulocytes: 0.14 10*3/uL — ABNORMAL HIGH (ref 0.00–0.07)
Basophils Absolute: 0.1 10*3/uL (ref 0.0–0.1)
Basophils Relative: 0 %
Eosinophils Absolute: 0 10*3/uL (ref 0.0–0.5)
Eosinophils Relative: 0 %
HCT: 50.3 % (ref 39.0–52.0)
Hemoglobin: 17 g/dL (ref 13.0–17.0)
Immature Granulocytes: 1 %
Lymphocytes Relative: 3 %
Lymphs Abs: 0.6 10*3/uL — ABNORMAL LOW (ref 0.7–4.0)
MCH: 33.4 pg (ref 26.0–34.0)
MCHC: 33.8 g/dL (ref 30.0–36.0)
MCV: 98.8 fL (ref 80.0–100.0)
Monocytes Absolute: 1.8 10*3/uL — ABNORMAL HIGH (ref 0.1–1.0)
Monocytes Relative: 8 %
Neutro Abs: 20 10*3/uL — ABNORMAL HIGH (ref 1.7–7.7)
Neutrophils Relative %: 88 %
Platelets: 429 10*3/uL — ABNORMAL HIGH (ref 150–400)
RBC: 5.09 MIL/uL (ref 4.22–5.81)
RDW: 14 % (ref 11.5–15.5)
WBC: 22.6 10*3/uL — ABNORMAL HIGH (ref 4.0–10.5)
nRBC: 0 % (ref 0.0–0.2)

## 2022-02-09 LAB — LIPASE, BLOOD: Lipase: 31 U/L (ref 11–51)

## 2022-02-09 LAB — COMPREHENSIVE METABOLIC PANEL
ALT: 79 U/L — ABNORMAL HIGH (ref 0–44)
AST: 160 U/L — ABNORMAL HIGH (ref 15–41)
Albumin: 5.1 g/dL — ABNORMAL HIGH (ref 3.5–5.0)
Alkaline Phosphatase: 110 U/L (ref 38–126)
Anion gap: 34 — ABNORMAL HIGH (ref 5–15)
BUN: 11 mg/dL (ref 6–20)
CO2: 10 mmol/L — ABNORMAL LOW (ref 22–32)
Calcium: 10 mg/dL (ref 8.9–10.3)
Chloride: 96 mmol/L — ABNORMAL LOW (ref 98–111)
Creatinine, Ser: 2.01 mg/dL — ABNORMAL HIGH (ref 0.61–1.24)
GFR, Estimated: 45 mL/min — ABNORMAL LOW (ref >60)
Glucose, Bld: 221 mg/dL — ABNORMAL HIGH (ref 70–99)
Potassium: 4.4 mmol/L (ref 3.5–5.1)
Sodium: 140 mmol/L (ref 135–145)
Total Bilirubin: 1.8 mg/dL — ABNORMAL HIGH (ref 0.3–1.2)
Total Protein: 8.4 g/dL — ABNORMAL HIGH (ref 6.5–8.1)

## 2022-02-09 LAB — URINALYSIS, ROUTINE W REFLEX MICROSCOPIC
Bilirubin Urine: NEGATIVE
Glucose, UA: NEGATIVE mg/dL
Ketones, ur: 80 mg/dL — AB
Leukocytes,Ua: NEGATIVE
Nitrite: NEGATIVE
Protein, ur: 100 mg/dL — AB
Specific Gravity, Urine: 1.016 (ref 1.005–1.030)
pH: 5 (ref 5.0–8.0)

## 2022-02-09 MED ORDER — ONDANSETRON HCL 4 MG PO TABS: 4.0000 mg | ORAL_TABLET | Freq: Four times a day (QID) | ORAL | 0 refills | Status: DC

## 2022-02-09 MED ORDER — LACTATED RINGERS IV BOLUS
1000.0000 mL | Freq: Once | INTRAVENOUS | Status: AC
  Administered 2022-02-09: 1000 mL via INTRAVENOUS

## 2022-02-09 MED ORDER — ONDANSETRON HCL 4 MG/2ML IJ SOLN
4.0000 mg | Freq: Once | INTRAMUSCULAR | Status: AC
  Administered 2022-02-09: 4 mg via INTRAVENOUS
  Filled 2022-02-09: qty 2

## 2022-02-09 MED ORDER — ONDANSETRON HCL 4 MG/2ML IJ SOLN
4.0000 mg | Freq: Once | INTRAMUSCULAR | Status: AC | PRN
  Administered 2022-02-09: 4 mg via INTRAVENOUS
  Filled 2022-02-09: qty 2

## 2022-02-09 MED ORDER — IOHEXOL 300 MG/ML  SOLN
100.0000 mL | Freq: Once | INTRAMUSCULAR | Status: AC | PRN
  Administered 2022-02-09: 100 mL via INTRAVENOUS

## 2022-02-09 MED ORDER — PANTOPRAZOLE SODIUM 40 MG IV SOLR
40.0000 mg | Freq: Once | INTRAVENOUS | Status: AC
  Administered 2022-02-09: 40 mg via INTRAVENOUS
  Filled 2022-02-09: qty 10

## 2022-02-09 NOTE — ED Provider Notes (Signed)
?[name] ?Provider Note ? ? ?CSN: [id number] ?Arrival date & time: 02/09/22  1929 ? ?  ? ?History ? ?Chief Complaint  ?Patient presents with  ? Hematemesis  ? ? ?Stephen [name] is a 31 y.o. male. ? ?HPI ? ?Patient is a 31 year old male with a history of EtOH abuse and tobacco use who presents to the emergency department after multiple episodes of emesis for the past 14 hours.  He describes his emesis as black with possible chunk of blood.  He report he probably at about 40 episodes.  He has associated left upper quadrant pain.  He stated that he did not eat anything last night.  He had a pint of liquor.  He does consume alcohol almost every day.  He also smokes every day.  Denies associated chest pain or fever.  He denies eating any exotic food.  Denies diarrhea or shortness of breath.  He reports he has not tried anything for his pain.  He stated he was given a nausea medication which improved his symptoms significantly.  Denies any urinary symptoms or focal weakness.  Denies sick contacts.  Denies any trauma to his abdomen.  Otherwise no other complaints. ?Home Medications ?Prior to Admission medications   ?Medication Sig Start Date End Date Taking? Authorizing Provider  ?ondansetron (ZOFRAN) 4 MG tablet Take 1 tablet (4 mg total) by mouth every 6 (six) hours. 02/09/22  Yes Jari SportsmanWodajo, Cadin Luka T, MD  ?dicyclomine (BENTYL) 20 MG tablet Take 1 tablet (20 mg total) by mouth 3 (three) times daily as needed for spasms. 09/16/18   Donnetta Hutchingook, Brian, MD  ?ibuprofen (ADVIL) 600 MG tablet Take 1 tablet (600 mg total) by mouth every 6 (six) hours as needed. 04/25/20   Darr, Gerilyn PilgrimJacob, PA-C  ?   ? ?Allergies    ?Patient has no known allergies.   ? ?Review of Systems   ?Review of Systems ? ?Physical Exam ?Updated Vital Signs ?BP (!) 151/90   Pulse (!) 115   Temp 98.3 ?F (36.8 ?C) (Oral)   Resp (!) 22   SpO2 100%  ?Physical Exam ?Vitals and nursing note reviewed.  ?Constitutional:   ?   General: He  is not in acute distress. ?   Appearance: He is well-developed.  ?   Comments: Thin appearing  ?HENT:  ?   Head: Normocephalic and atraumatic.  ?   Mouth/Throat:  ?   Mouth: Mucous membranes are dry.  ?Eyes:  ?   Extraocular Movements: Extraocular movements intact.  ?   Conjunctiva/sclera: Conjunctivae normal.  ?   Pupils: Pupils are equal, round, and reactive to light.  ?Cardiovascular:  ?   Rate and Rhythm: Regular rhythm. Tachycardia present.  ?   Heart sounds: No murmur heard. ?Pulmonary:  ?   Effort: Pulmonary effort is normal. No respiratory distress.  ?   Breath sounds: Normal breath sounds.  ?Chest:  ?   Chest wall: No tenderness.  ?Abdominal:  ?   Palpations: Abdomen is soft.  ?   Tenderness: There is abdominal tenderness. There is guarding. There is no rebound.  ?   Comments: Left upper quadrant tenderness with mild guarding.  No rebound tenderness  ?Musculoskeletal:     ?   General: No swelling or tenderness.  ?   Cervical back: Neck supple.  ?Skin: ?   General: Skin is warm and dry.  ?   Capillary Refill: Capillary refill takes less than 2 seconds.  ?Neurological:  ?  General: No focal deficit present.  ?   Mental Status: He is alert and oriented to person, place, and time.  ?   Cranial Nerves: No cranial nerve deficit.  ?   Sensory: No sensory deficit.  ?Psychiatric:  ?   Comments: Anxious  ? ? ?ED Results / Procedures / Treatments   ?Labs ?(all labs ordered are listed, but only abnormal results are displayed) ?Labs Reviewed  ?CBC WITH DIFFERENTIAL/PLATELET - Abnormal; Notable for the following components:  ?    Result Value  ? WBC 22.6 (*)   ? Platelets 429 (*)   ? Neutro Abs 20.0 (*)   ? Lymphs Abs 0.6 (*)   ? Monocytes Absolute 1.8 (*)   ? Abs Immature Granulocytes 0.14 (*)   ? All other components within normal limits  ?COMPREHENSIVE METABOLIC PANEL - Abnormal; Notable for the following components:  ? Chloride 96 (*)   ? CO2 10 (*)   ? Glucose, Bld 221 (*)   ? Creatinine, Ser 2.01 (*)   ? Total  Protein 8.4 (*)   ? Albumin 5.1 (*)   ? AST 160 (*)   ? ALT 79 (*)   ? Total Bilirubin 1.8 (*)   ? GFR, Estimated 45 (*)   ? Anion gap 34 (*)   ? All other components within normal limits  ?URINALYSIS, ROUTINE W REFLEX MICROSCOPIC - Abnormal; Notable for the following components:  ? APPearance HAZY (*)   ? Hgb urine dipstick MODERATE (*)   ? Ketones, ur 80 (*)   ? Protein, ur 100 (*)   ? Bacteria, UA RARE (*)   ? All other components within normal limits  ?LIPASE, BLOOD  ? ? ?EKG ?EKG Interpretation ? ?Date/Time:  Friday February 09 2022 19:34:32 EDT ?Ventricular Rate:  149 ?PR Interval:  136 ?QRS Duration: 78 ?QT Interval:  320 ?QTC Calculation: 504 ?R Axis:   100 ?Text Interpretation: Sinus tachycardia Biatrial enlargement Rightward axis Pulmonary disease pattern Nonspecific ST and T wave abnormality Abnormal ECG When compared with ECG of 13-Jul-2002 09:27, PREVIOUS ECG IS PRESENT when compared to prior, faster rate. No STEMI Confirmed by Theda Belfast ([id number]) on 02/09/2022 8:29:42 PM ? ?Radiology ?CT Abdomen Pelvis W Contrast ? ?Result Date: 02/09/2022 ?CLINICAL DATA:  Peritonitis or perforation suspected. EXAM: CT ABDOMEN AND PELVIS WITH CONTRAST TECHNIQUE: Multidetector CT imaging of the abdomen and pelvis was performed using the standard protocol following bolus administration of intravenous contrast. RADIATION DOSE REDUCTION: This exam was performed according to the departmental dose-optimization program which includes automated exposure control, adjustment of the mA and/or kV according to patient size and/or use of iterative reconstruction technique. CONTRAST:  OMNIPAQUE IOHEXOL 300 MG/ML  SOLN COMPARISON:  09/16/2018. FINDINGS: Lower chest: No acute abnormality. Hepatobiliary: No focal liver abnormality is seen. Hepatic steatosis is noted. No gallstones, gallbladder wall thickening, or biliary dilatation. Pancreas: Unremarkable. No pancreatic ductal dilatation or surrounding inflammatory changes. Spleen:  Normal in size without focal abnormality. Adrenals/Urinary Tract: The adrenal glands are within normal limits. A nonobstructive calculus is present in the lower pole the left kidney. The kidneys enhance symmetrically. A subcentimeter hypodensity is noted in the lower pole of the left kidney which is too small to further characterize. No hydronephrosis. The bladder is decompressed. Stomach/Bowel: There is thickening of the walls of the distal esophagus and gastric rugae. No bowel obstruction, free air, or pneumatosis. Mild diffuse colonic wall thickening is noted. A normal appendix is present in the right lower quadrant. Vascular/Lymphatic:  No significant vascular findings are present. No enlarged abdominal or pelvic lymph nodes. Reproductive: Prostate is unremarkable. Other: No abdominal wall hernia or abnormality. No abdominopelvic ascites. Musculoskeletal: No acute osseous abnormality. IMPRESSION: 1. Thickening of the walls of the distal esophagus and gastric rugae suggesting gastritis/esophagitis. 2. Mild diffuse colonic wall thickening, possible colitis. 3. Nonobstructive left renal calculus. 4. Hepatic steatosis. Electronically Signed   By: Thornell Sartorius M.D.   On: 02/09/2022 20:35  ? ?DG Abdomen Acute W/Chest ? ?Result Date: 02/09/2022 ?CLINICAL DATA:  Abdominal pain, nausea/vomiting EXAM: DG ABDOMEN ACUTE WITH 1 VIEW CHEST COMPARISON:  CT abdomen/pelvis dated 02/01/2022. Chest radiograph dated 05/24/2008. FINDINGS: Lungs are clear.  No pleural effusion or pneumothorax. The heart is normal in size. Nonobstructive bowel gas pattern. Excretory contrast in the bilateral renal collecting system and bladder. Visualized osseous structures are within normal limits. IMPRESSION: Negative abdominal radiographs.  No acute cardiopulmonary disease. Electronically Signed   By: Charline Bills M.D.   On: 02/09/2022 22:12   ? ?Procedures ?Procedures  ? ?Medications Ordered in ED ?Medications  ?ondansetron (ZOFRAN) injection  4 mg (4 mg Intravenous Given 02/09/22 2003)  ?iohexol (OMNIPAQUE) 300 MG/ML solution 100 mL (100 mLs Intravenous Contrast Given 02/09/22 2010)  ?pantoprazole (PROTONIX) injection 40 mg (40 mg Intravenous Give

## 2022-02-09 NOTE — Discharge Instructions (Signed)
You have been evaluated multiple episode of vomiting.  Your work-up showed increase of your kidney function.  This is most likely from dehydration in the setting of your multiple episode of vomiting.  Your abdominal scan did not show any acute findings. ? ?Continue to aggressively hydrate.  I provided you Zofran for symptomatic management.  please follow-up with your primary care provider. ?

## 2022-02-09 NOTE — ED Provider Triage Note (Addendum)
Emergency Medicine Provider Triage Evaluation Note ? ?Stephen [name] , a 31 y.o. male  was evaluated in triage.  Pt complains of abdominal pain.  Pain started at about 7 AM this morning.  It started very suddenly.  It is generalized.  He says it is 10 of 10 in intensity.  He has associated nausea and vomiting.  He has been unable to keep anything down.  He says he started vomiting a large amount of blood.  He has filled up cupful of blood in his vomit.  He has also had a couple episodes of diarrhea that are nonbloody.  Never had anything like this before.  He denies history of liver disease or heavy NSAID use. He does have hx of binge drinking. Last drank last night.  ? ?Review of Systems  ?Positive:  ?Negative:  ? ?Physical Exam  ?BP (!) 130/99   Pulse (!) 157   Temp 98.3 ?F (36.8 ?C) (Oral)   Resp 18   SpO2 100%  ?Gen:   Awake, no distress   ?Resp:  Normal effort  ?MSK:   Moves extremities without difficulty  ?Other:   ?Patient appears very uncomfortable.  His abdomen is very rigid and tender all all over.  He is not actively vomiting right now. ? ?Medical Decision Making  ?Medically screening exam initiated at 7:41 PM.  Appropriate orders placed.  PLATON AROCHO was informed that the remainder of the evaluation will be completed by another provider, this initial triage assessment does not replace that evaluation, and the importance of remaining in the ED until their evaluation is complete. ? ?Patient is very tachycardic to 157 here.  He is a pretty concerning exam.  He will need CT scan of his abdomen as well as labs.  He will likely need to bypass creatinine function for CT scan. ?  ?Therese Sarah ?02/09/22 1944 ? ?  ?Claudie Leach, PA-C ?02/09/22 1957 ? ?

## 2022-02-09 NOTE — ED Notes (Signed)
MD stated ok for patient to eat/drink ?

## 2022-02-09 NOTE — ED Triage Notes (Signed)
Pt endorses hx of binge drinking, states he does it often and last drink was last night.  ?

## 2022-02-09 NOTE — ED Triage Notes (Signed)
Pt brought to ED by [facility] via wheelchair with c/o vomiting x12 and noticed blood after onset of emesis. Also complains of LUQ abdominal pain, has hx of rib  injury to area of pain post fall in shower.  ? ?EMS vitals ?BP 124/98 ?HR 126 ?RR 16 ?SPO2 98% ?

## 2022-03-30 ENCOUNTER — Encounter (HOSPITAL_COMMUNITY): Payer: Self-pay

## 2022-03-30 ENCOUNTER — Ambulatory Visit (HOSPITAL_COMMUNITY)
Admission: EM | Admit: 2022-03-30 | Discharge: 2022-03-30 | Disposition: A | Discharge status: Home / Self Care | Payer: Self-pay | Attending: Physician Assistant | Admitting: Physician Assistant

## 2022-03-30 DIAGNOSIS — S80262A Insect bite (nonvenomous), left knee, initial encounter: Secondary | ICD-10-CM

## 2022-03-30 DIAGNOSIS — W57XXXA Bitten or stung by nonvenomous insect and other nonvenomous arthropods, initial encounter: Secondary | ICD-10-CM

## 2022-03-30 MED ORDER — STERILE WATER FOR INJECTION IJ SOLN
INTRAMUSCULAR | Status: AC
  Filled 2022-03-30: qty 10

## 2022-03-30 MED ORDER — METHYLPREDNISOLONE SODIUM SUCC 125 MG IJ SOLR
125.0000 mg | Freq: Once | INTRAMUSCULAR | Status: AC
  Administered 2022-03-30: 125 mg via INTRAMUSCULAR

## 2022-03-30 MED ORDER — DOXYCYCLINE HYCLATE 100 MG PO CAPS: 100.0000 mg | ORAL_CAPSULE | Freq: Two times a day (BID) | ORAL | 0 refills | Status: DC

## 2022-03-30 MED ORDER — METHYLPREDNISOLONE SODIUM SUCC 125 MG IJ SOLR
INTRAMUSCULAR | Status: AC
  Filled 2022-03-30: qty 2

## 2022-03-30 NOTE — ED Triage Notes (Signed)
Pt c/o bug bite to lt knee yesterday. C/o swelling, drainage, and redness up leg today.

## 2022-03-30 NOTE — ED Provider Notes (Signed)
[facility]    CSN: [id number] Arrival date & time: 03/30/22  1910      History   Chief Complaint Chief Complaint  Patient presents with   Insect Bite    HPI Stephen [name] is a 31 y.o. male.   Patient reports he was stung by a black flying insect.  Patient reports he was stung on his left knee.  Patient reports that he has a rash around the area.  Reports there was a small pustule that drained today.  Patient complains of increased redness.  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  Denies any difficulty breathing he has not had any throat swelling  The history is provided by the patient. No language interpreter was used.    Past Medical History:  Diagnosis Date   Depression     There are no problems to display for this patient.   Past Surgical History:  Procedure Laterality Date   WISDOM TOOTH EXTRACTION         Home Medications    Prior to Admission medications   Medication Sig Start Date End Date Taking? Authorizing Provider  doxycycline (VIBRAMYCIN) 100 MG capsule Take 1 capsule (100 mg total) by mouth 2 (two) times daily. 03/30/22  Yes Cheron Schaumann K, PA-C  dicyclomine (BENTYL) 20 MG tablet Take 1 tablet (20 mg total) by mouth 3 (three) times daily as needed for spasms. 09/16/18   Donnetta Hutching, MD  ibuprofen (ADVIL) 600 MG tablet Take 1 tablet (600 mg total) by mouth every 6 (six) hours as needed. 04/25/20   Darr, Gerilyn Pilgrim, PA-C  ondansetron (ZOFRAN) 4 MG tablet Take 1 tablet (4 mg total) by mouth every 6 (six) hours. 02/09/22   Jari Sportsman, MD    Family History History reviewed. No pertinent family history.  Social History Social History   Tobacco Use   Smoking status: Every Day    Packs/day: 2.00    Types: Cigarettes   Smokeless tobacco: Never  Vaping Use   Vaping Use: Never used  Substance Use Topics   Alcohol use: Yes    Alcohol/week: 14.0 standard drinks of alcohol    Types: 14 Cans of beer per week    Comment: 2/night    Drug use: Yes    Types: Marijuana     Allergies   Patient has no known allergies.   Review of Systems Review of Systems  All other systems reviewed and are negative.    Physical Exam Triage Vital Signs ED Triage Vitals [03/30/22 1948]  Enc Vitals Group     BP 133/73     Pulse Rate (!) 120     Resp 18     Temp 98.9 F (37.2 C)     Temp Source Oral     SpO2 96 %     Weight      Height      Head Circumference      Peak Flow      Pain Score 5     Pain Loc      Pain Edu?      Excl. in [facility]?    No data found.  Updated Vital Signs BP 133/73 (BP Location: Left Arm)   Pulse (!) 120   Temp 98.9 F (37.2 C) (Oral)   Resp 18   SpO2 96%   Visual Acuity Right Eye Distance:   Left Eye Distance:   Bilateral Distance:    Right Eye Near:  Left Eye Near:    Bilateral Near:     Physical Exam Vitals and nursing note reviewed.  Constitutional:      Appearance: He is well-developed.  HENT:     Head: Normocephalic.  Pulmonary:     Effort: Pulmonary effort is normal.  Abdominal:     General: There is no distension.  Musculoskeletal:     Cervical back: Normal range of motion.     Comments: Erythema of left knee, small pustule,  Neurological:     Mental Status: He is alert and oriented to person, place, and time.      UC Treatments / Results  Labs (all labs ordered are listed, but only abnormal results are displayed) Labs Reviewed - No data to display  EKG   Radiology No results found.  Procedures Procedures (including critical care time)  Medications Ordered in UC Medications  methylPREDNISolone sodium succinate (SOLU-MEDROL) 125 mg/2 mL injection 125 mg (125 mg Intramuscular Given 03/30/22 2009)    Initial Impression / Assessment and Plan / UC Course  I have reviewed the triage vital signs and the nursing notes.  Pertinent labs & imaging results that were available during my care of the patient were reviewed by me and considered in my medical  decision making (see chart for details).     MDM patient is seen Solu-Medrol.  He is advised to take Benadryl 25 mg every 4 hours.  I am concerned patient could be developing infection as he is advised today pustule present has drained.  Patient is advised Benadryl every 4 hours he is given doxycycline.  Return if symptoms have not resolved or if symptoms worsen Final Clinical Impressions(s) / UC Diagnoses   Final diagnoses:  Insect bite of left knee, initial encounter     Discharge Instructions      Benadryl 25 mg every 4 hours.  Recheck here tomorrow if not improved/resolvind   ED Prescriptions     Medication Sig Dispense Auth. Provider   doxycycline (VIBRAMYCIN) 100 MG capsule Take 1 capsule (100 mg total) by mouth 2 (two) times daily. 20 capsule Elson Areas, [place]      PDMP not reviewed this encounter. An After Visit Summary was printed and given to the patient.    Elson Areas, [place] 03/30/22 2016

## 2022-03-30 NOTE — Discharge Instructions (Addendum)
Benadryl 25 mg every 4 hours.  Recheck here tomorrow if not improved/resolvind

## 2022-10-28 ENCOUNTER — Other Ambulatory Visit: Payer: Self-pay

## 2022-10-28 ENCOUNTER — Emergency Department (HOSPITAL_COMMUNITY): Payer: Self-pay

## 2022-10-28 ENCOUNTER — Emergency Department (HOSPITAL_COMMUNITY)
Admission: EM | Admit: 2022-10-28 | Discharge: 2022-10-28 | Disposition: A | Discharge status: Home / Self Care | Payer: Self-pay | Attending: Emergency Medicine | Admitting: Emergency Medicine

## 2022-10-28 ENCOUNTER — Encounter (HOSPITAL_COMMUNITY): Payer: Self-pay

## 2022-10-28 DIAGNOSIS — D72829 Elevated white blood cell count, unspecified: Secondary | ICD-10-CM | POA: Insufficient documentation

## 2022-10-28 DIAGNOSIS — E876 Hypokalemia: Secondary | ICD-10-CM

## 2022-10-28 DIAGNOSIS — R252 Cramp and spasm: Secondary | ICD-10-CM | POA: Insufficient documentation

## 2022-10-28 DIAGNOSIS — R569 Unspecified convulsions: Secondary | ICD-10-CM

## 2022-10-28 DIAGNOSIS — E860 Dehydration: Secondary | ICD-10-CM

## 2022-10-28 DIAGNOSIS — R7401 Elevation of levels of liver transaminase levels: Secondary | ICD-10-CM

## 2022-10-28 DIAGNOSIS — R258 Other abnormal involuntary movements: Secondary | ICD-10-CM | POA: Insufficient documentation

## 2022-10-28 DIAGNOSIS — R8289 Other abnormal findings on cytological and histological examination of urine: Secondary | ICD-10-CM | POA: Insufficient documentation

## 2022-10-28 LAB — CBC WITH DIFFERENTIAL/PLATELET
Abs Immature Granulocytes: 0.03 10*3/uL (ref 0.00–0.07)
Basophils Absolute: 0.1 10*3/uL (ref 0.0–0.1)
Basophils Relative: 0 %
Eosinophils Absolute: 0 10*3/uL (ref 0.0–0.5)
Eosinophils Relative: 0 %
HCT: 41.1 % (ref 39.0–52.0)
Hemoglobin: 14.9 g/dL (ref 13.0–17.0)
Immature Granulocytes: 0 %
Lymphocytes Relative: 9 %
Lymphs Abs: 1 10*3/uL (ref 0.7–4.0)
MCH: 33.3 pg (ref 26.0–34.0)
MCHC: 36.3 g/dL — ABNORMAL HIGH (ref 30.0–36.0)
MCV: 91.7 fL (ref 80.0–100.0)
Monocytes Absolute: 1.3 10*3/uL — ABNORMAL HIGH (ref 0.1–1.0)
Monocytes Relative: 12 %
Neutro Abs: 8.7 10*3/uL — ABNORMAL HIGH (ref 1.7–7.7)
Neutrophils Relative %: 79 %
Platelets: 389 10*3/uL (ref 150–400)
RBC: 4.48 MIL/uL (ref 4.22–5.81)
RDW: 13.2 % (ref 11.5–15.5)
WBC: 11.2 10*3/uL — ABNORMAL HIGH (ref 4.0–10.5)
nRBC: 0 % (ref 0.0–0.2)

## 2022-10-28 LAB — COMPREHENSIVE METABOLIC PANEL
ALT: 45 U/L — ABNORMAL HIGH (ref 0–44)
AST: 86 U/L — ABNORMAL HIGH (ref 15–41)
Albumin: 4.1 g/dL (ref 3.5–5.0)
Alkaline Phosphatase: 91 U/L (ref 38–126)
Anion gap: 14 (ref 5–15)
BUN: 7 mg/dL (ref 6–20)
CO2: 24 mmol/L (ref 22–32)
Calcium: 9.6 mg/dL (ref 8.9–10.3)
Chloride: 98 mmol/L (ref 98–111)
Creatinine, Ser: 0.89 mg/dL (ref 0.61–1.24)
GFR, Estimated: >60 mL/min (ref >60)
Glucose, Bld: 136 mg/dL — ABNORMAL HIGH (ref 70–99)
Potassium: 3.2 mmol/L — ABNORMAL LOW (ref 3.5–5.1)
Sodium: 136 mmol/L (ref 135–145)
Total Bilirubin: 2.5 mg/dL — ABNORMAL HIGH (ref 0.3–1.2)
Total Protein: 7.3 g/dL (ref 6.5–8.1)

## 2022-10-28 LAB — URINALYSIS, ROUTINE W REFLEX MICROSCOPIC
Bacteria, UA: NONE SEEN
Glucose, UA: NEGATIVE mg/dL
Hgb urine dipstick: NEGATIVE
Ketones, ur: 80 mg/dL — AB
Leukocytes,Ua: NEGATIVE
Nitrite: NEGATIVE
Protein, ur: 100 mg/dL — AB
Specific Gravity, Urine: 1.027 (ref 1.005–1.030)
pH: 7 (ref 5.0–8.0)

## 2022-10-28 LAB — CBG MONITORING, ED: Glucose-Capillary: 158 mg/dL — ABNORMAL HIGH (ref 70–99)

## 2022-10-28 LAB — MAGNESIUM: Magnesium: 1.6 mg/dL — ABNORMAL LOW (ref 1.7–2.4)

## 2022-10-28 LAB — CK: Total CK: 347 U/L (ref 49–397)

## 2022-10-28 LAB — ETHANOL: Alcohol, Ethyl (B): <10 mg/dL (ref <10)

## 2022-10-28 MED ORDER — IOHEXOL 350 MG/ML SOLN
75.0000 mL | Freq: Once | INTRAVENOUS | Status: AC | PRN
  Administered 2022-10-28: 75 mL via INTRAVENOUS

## 2022-10-28 MED ORDER — MAGNESIUM OXIDE 400 MG PO CAPS: 400.0000 mg | ORAL_CAPSULE | Freq: Every day | ORAL | 0 refills | Status: AC

## 2022-10-28 MED ORDER — MAGNESIUM SULFATE IN D5W 1-5 GM/100ML-% IV SOLN
1.0000 g | Freq: Once | INTRAVENOUS | Status: AC
  Administered 2022-10-28: 1 g via INTRAVENOUS
  Filled 2022-10-28: qty 100

## 2022-10-28 MED ORDER — POTASSIUM CHLORIDE CRYS ER 20 MEQ PO TBCR
40.0000 meq | EXTENDED_RELEASE_TABLET | Freq: Once | ORAL | Status: AC
  Administered 2022-10-28: 40 meq via ORAL
  Filled 2022-10-28: qty 2

## 2022-10-28 MED ORDER — LACTATED RINGERS IV BOLUS
2000.0000 mL | Freq: Once | INTRAVENOUS | Status: AC
  Administered 2022-10-28: 2000 mL via INTRAVENOUS

## 2022-10-28 MED ORDER — THIAMINE HCL 100 MG PO TABS: 100.0000 mg | ORAL_TABLET | Freq: Every day | ORAL | 0 refills | Status: AC

## 2022-10-28 MED ORDER — CHLORDIAZEPOXIDE HCL 25 MG PO CAPS: ORAL_CAPSULE | ORAL | 0 refills | Status: DC

## 2022-10-28 MED ORDER — POTASSIUM CHLORIDE ER 10 MEQ PO TBCR: 20.0000 meq | EXTENDED_RELEASE_TABLET | Freq: Every day | ORAL | 0 refills | Status: DC

## 2022-10-28 MED ORDER — CHLORDIAZEPOXIDE HCL 25 MG PO CAPS
25.0000 mg | ORAL_CAPSULE | Freq: Once | ORAL | Status: AC
Start: 2022-10-28 — End: 2022-10-28
  Administered 2022-10-28: 25 mg via ORAL
  Filled 2022-10-28: qty 1

## 2022-10-28 NOTE — ED Provider Notes (Signed)
[place] EMERGENCY DEPARTMENT Provider Note   CSN: [id number] Arrival date & time: 10/28/22  1622     History  No chief complaint on file.   Stephen [name] is a 32 y.o. male.  With PMH of depression who presents with complaints of seizure-like activity.  Patient notes 3 episodes of seizure-like activity in the past 3 days.  The longest episode was 30 minutes around 2:45 PM today.  He says during these episodes his girlfriend has witnessed he has clenching and tonic activity of his arms with drooling and unresponsiveness.  He has been complaining of muscle cramping in his arms and legs since.  He has had no tongue bites no incontinence no headaches or other complaints.  He denies any recent traumatic injuries.  He has no history of seizures.  He is generally healthy and notes he drinks only 3-4 beers weekly and denies daily drinking has not drank in a few days.  Denies any strenuous workout.  There is been no associated chest pain or shortness of breath.  He has never been evaluated for this in the past.  HPI     Home Medications Prior to Admission medications   Medication Sig Start Date End Date Taking? Authorizing Provider  dicyclomine (BENTYL) 20 MG tablet Take 1 tablet (20 mg total) by mouth 3 (three) times daily as needed for spasms. 09/16/18   Nat Christen, MD  doxycycline (VIBRAMYCIN) 100 MG capsule Take 1 capsule (100 mg total) by mouth 2 (two) times daily. 03/30/22   Fransico Meadow, PA-C  ibuprofen (ADVIL) 600 MG tablet Take 1 tablet (600 mg total) by mouth every 6 (six) hours as needed. 04/25/20   Darr, Edison Nasuti, PA-C  ondansetron (ZOFRAN) 4 MG tablet Take 1 tablet (4 mg total) by mouth every 6 (six) hours. 02/09/22   Donnamarie Poag, MD      Allergies    Patient has no known allergies.    Review of Systems   Review of Systems  Physical Exam Updated Vital Signs BP (!) 147/104   Pulse (!) 107   Temp 98.1 F (36.7 C)   Ht 5\' 10"  (1.778 m)   Wt 65.5 kg    SpO2 100%   BMI 20.72 kg/m  Physical Exam  ED Results / Procedures / Treatments   Labs (all labs ordered are listed, but only abnormal results are displayed) Labs Reviewed  CBG MONITORING, ED - Abnormal; Notable for the following components:      Result Value   Glucose-Capillary 158 (*)    All other components within normal limits  CBC WITH DIFFERENTIAL/PLATELET  COMPREHENSIVE METABOLIC PANEL  URINALYSIS, ROUTINE W REFLEX MICROSCOPIC    EKG None  Radiology No results found.  Procedures Procedures  {Document cardiac monitor, telemetry assessment procedure when appropriate:1}  Medications Ordered in ED Medications - No data to display  ED Course/ Medical Decision Making/ A&P   {   Click here for ABCD2, HEART and other calculatorsREFRESH Note before signing :1}                          Medical Decision Making  Stephen [name] is a 32 y.o. male.  With PMH of depression who presents with complaints of seizure-like activity.  Patient notes 3 episodes of seizure-like activity in the past 3 days.  The longest episode was 30 minutes around 2:45 PM today.    Amount and/or Complexity of Data Reviewed Labs:  ordered. Radiology: ordered.    Final Clinical Impression(s) / ED Diagnoses Final diagnoses:  None    Rx / DC Orders ED Discharge Orders     None

## 2022-10-28 NOTE — ED Triage Notes (Signed)
Pt states he was to his car to go to work today. Pt states he was feeling weak. Pt states "my brain just shut off." Pt states the next thing he remembers he was in his driver's seat and both arms were rigid and shaking. Pt states he called his boss and he was having slurred speech and drooling. Pt states the exact same thing happened two days ago and he states his girlfriend witnessed it. Pt states his ears bilat ring as "loud as a train" intermittently.

## 2022-10-28 NOTE — Discharge Instructions (Signed)
You have been seen in the emergency department today for a likely seizure.  Your workup today including labs did not reveal a definite cause of your seizures.  Please followup with your doctor as soon as possible, ideally within the week, regarding today's emergency department visit and your likely seizure.  Your potassium and magnesium were slightly low so we have sent in more supplements to help with this.  As we discussed, it is hard to tell if your seizures are related to alcohol withdrawal from cutting down on the amount you drink previously versus true epilepsy.  I am giving you medicine called Librium to take to come off of alcohol.  You certainly cannot drink any alcohol with this medicine as it can lead to decreased breathing and possible death.  You will also need to follow up with a neurologist as soon as possible, please call the number listed below to schedule a follow-up appointment as soon as possible.   As we have discussed, it is very important that you DO NOT DRIVE until you have been seen and cleared by your neurologist. Remember to get plenty of sleep and avoid any alcohol or drug use.  I have put in a referral to GI for outpatient follow-up for elevation in your liver labs.  Return to the emergency department if you have any further seizures, develop any weakness/numbness of any arm/leg, confusion, slurred speech, fever > 100.4, neck pain/stiffness or sudden/severe headache.

## 2022-10-28 NOTE — ED Notes (Signed)
Pt states since the incident he his arms have been shaky int. Pt's arm was shaking a lot while signing the consent to be seen

## 2022-11-17 IMAGING — CR DG ABDOMEN ACUTE W/ 1V CHEST
3 series · 4 of 4 positions shown · non-contrast
Comparison: CT abdomen/pelvis dated 02/01/2022. Chest radiograph
dated 05/24/2008.

CLINICAL DATA: Abdominal pain, nausea/vomiting

EXAM:
DG ABDOMEN ACUTE WITH 1 VIEW CHEST

[abdomen erect]
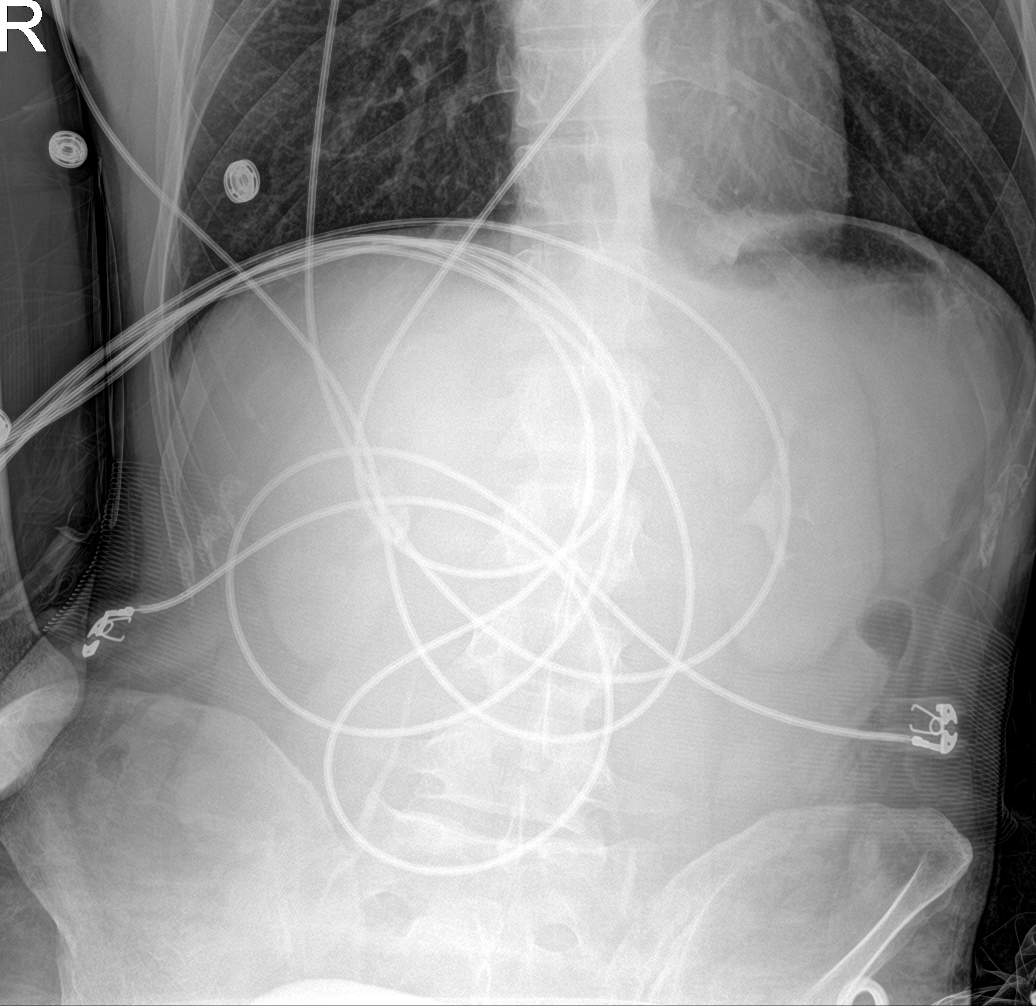

[abdomen supine]
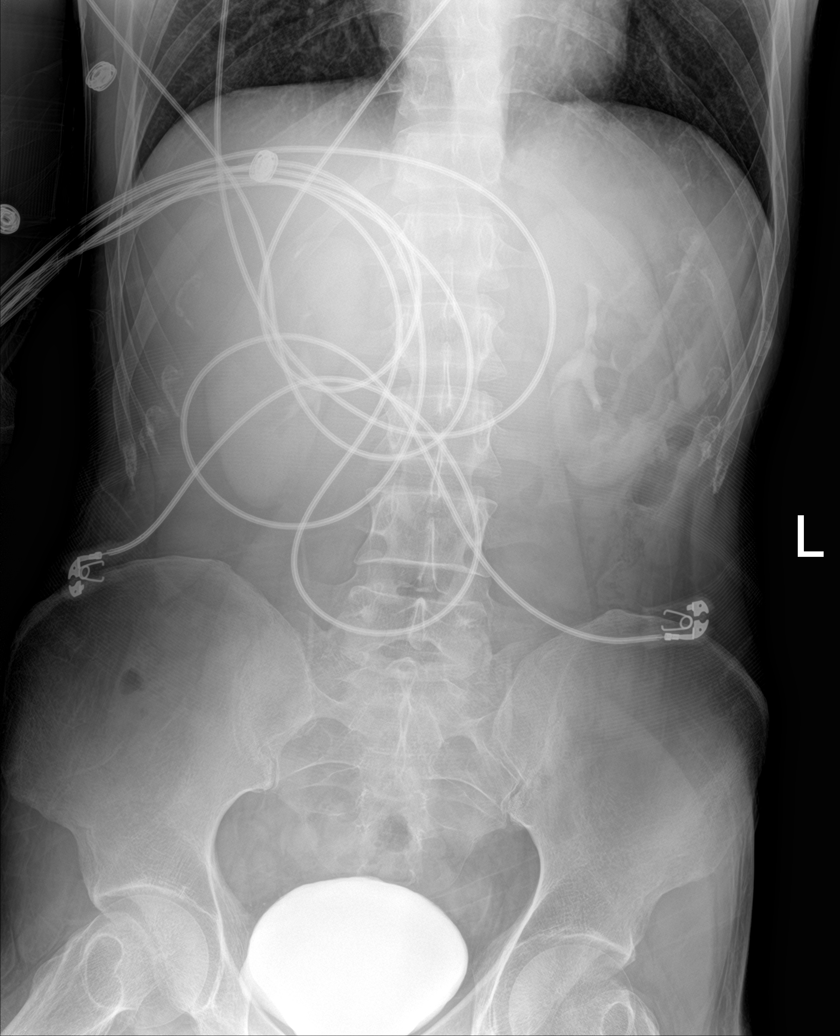

[Series 6: chest ap · 0.14mm/px · 2 of 2 slices shown]
[im 1/2]
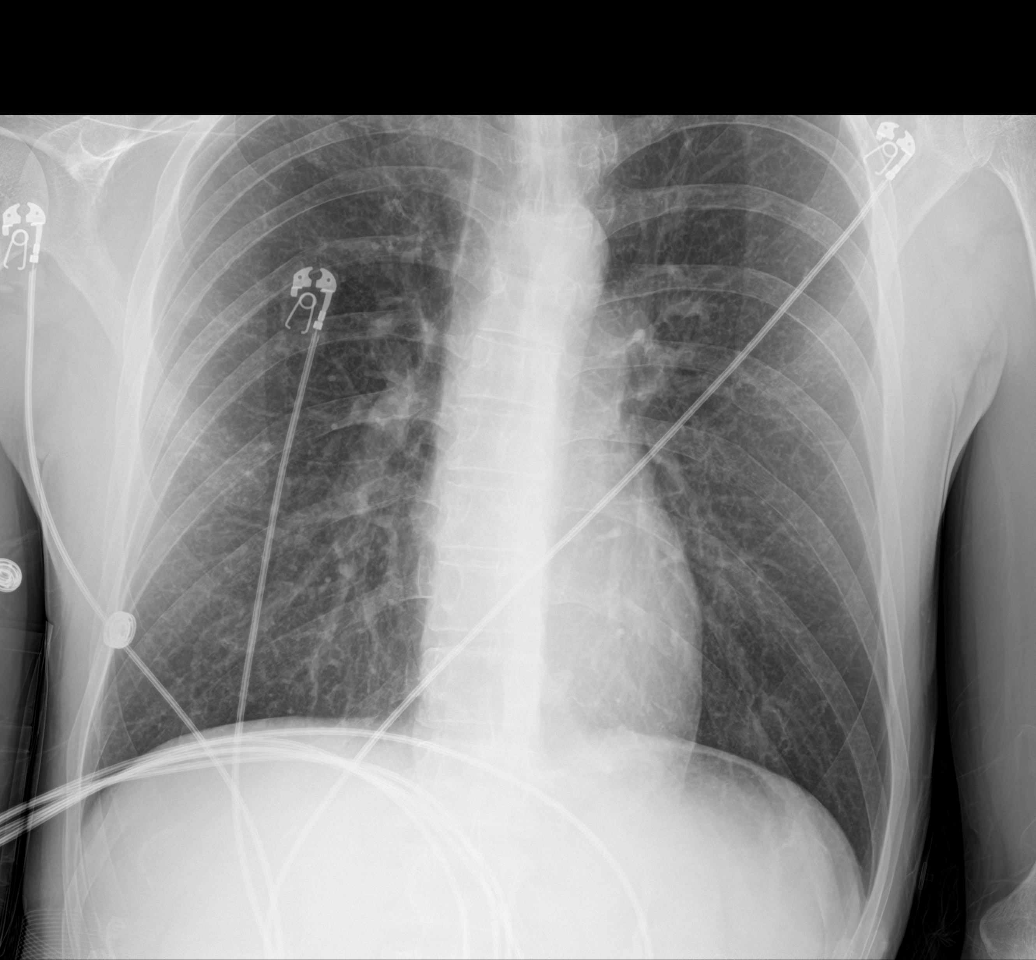
[im 2/2]
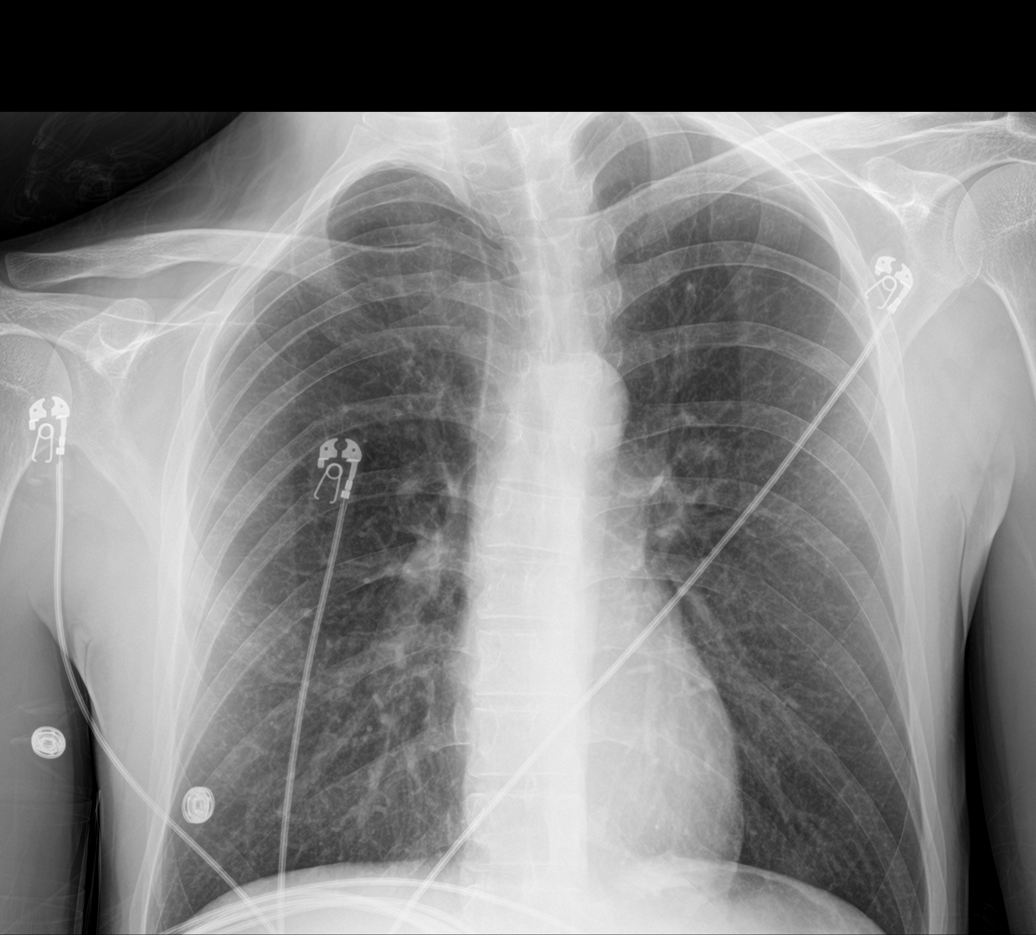

[4 of 4 positions shown; findings below may reference images not displayed]

FINDINGS: Lungs are clear.  No pleural effusion or pneumothorax.

The heart is normal in size.

Nonobstructive bowel gas pattern.

Excretory contrast in the bilateral renal collecting system and
bladder.

Visualized osseous structures are within normal limits.
IMPRESSION: Negative abdominal radiographs.  No acute cardiopulmonary disease.

## 2022-11-17 IMAGING — CT CT ABD-PELV W/ CM
3 of 4 series · 11 of 46 positions shown, 16 images · IV contrast (APPLIED)
Comparison: 09/16/2018.

CLINICAL DATA: Peritonitis or perforation suspected.

EXAM:
CT ABDOMEN AND PELVIS WITH CONTRAST
TECHNIQUE: Multidetector CT imaging of the abdomen and pelvis was performed
using the standard protocol following bolus administration of
intravenous contrast.

[Series 3: abdomen 5.0 · axial · 0.63mm/px · z∈[+804,+1119]mm · 7 of 85 slices shown, 12 images]
[im 11/85  soft-tissue]
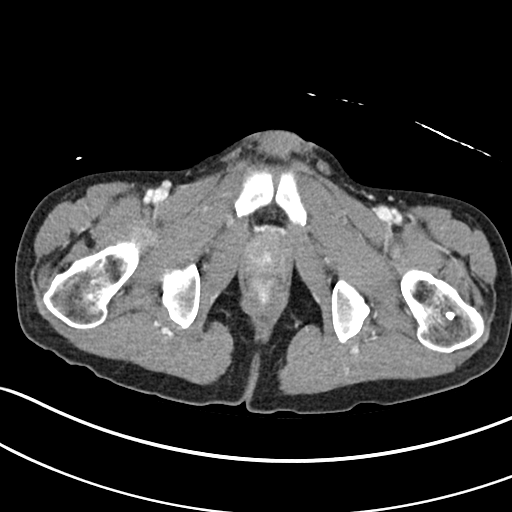
[im 11/85  bone]
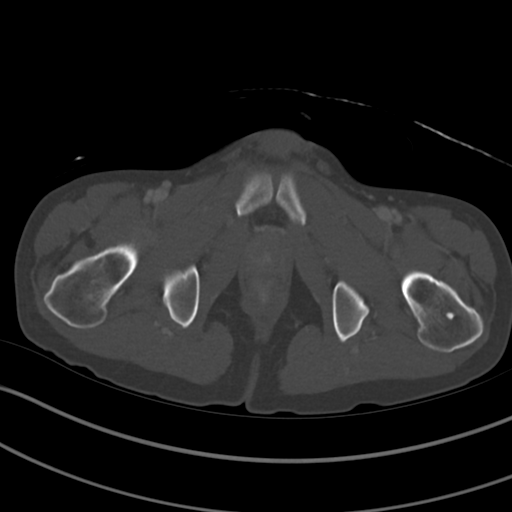
[im 22/85  soft-tissue]
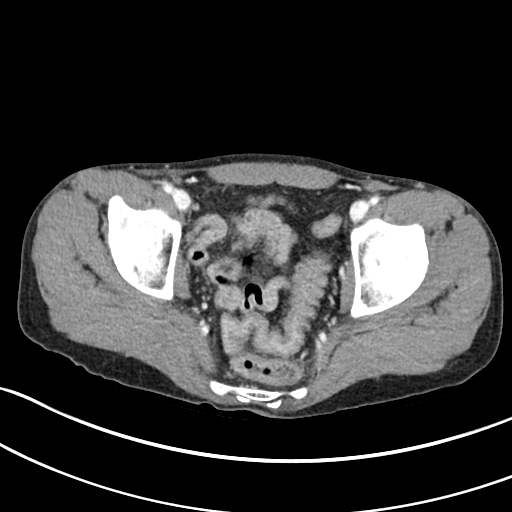
[im 32/85  soft-tissue]
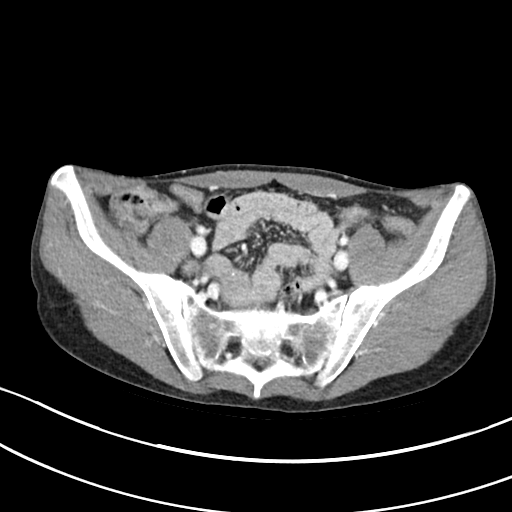
[im 43/85  soft-tissue]
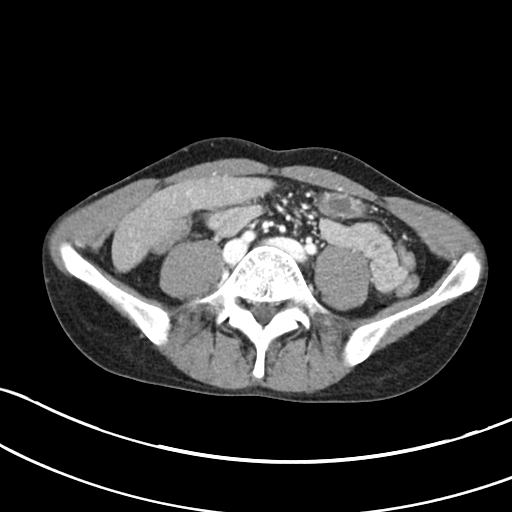
[im 43/85  lung]
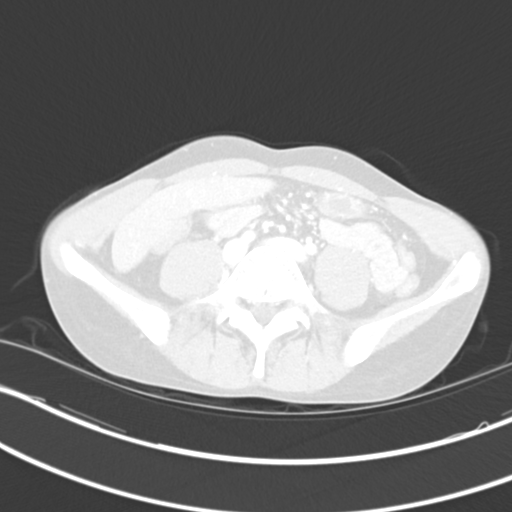
[im 53/85  soft-tissue]
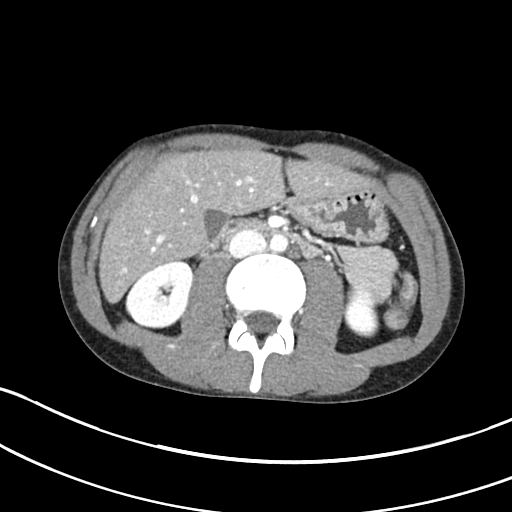
[im 53/85  lung]
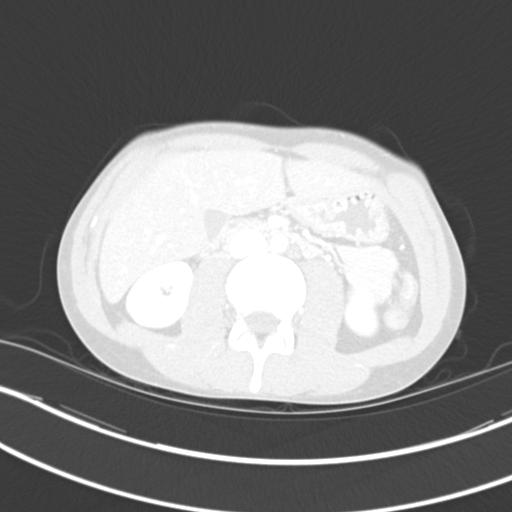
[im 64/85  soft-tissue]
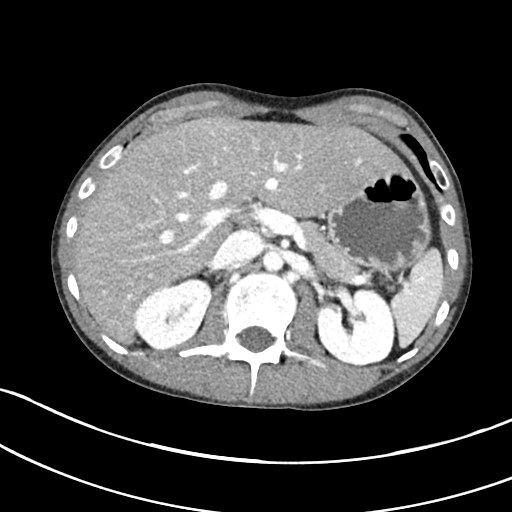
[im 64/85  lung]
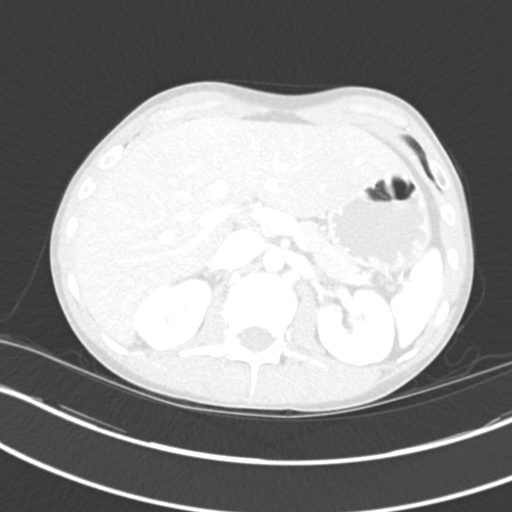
[im 74/85  soft-tissue]
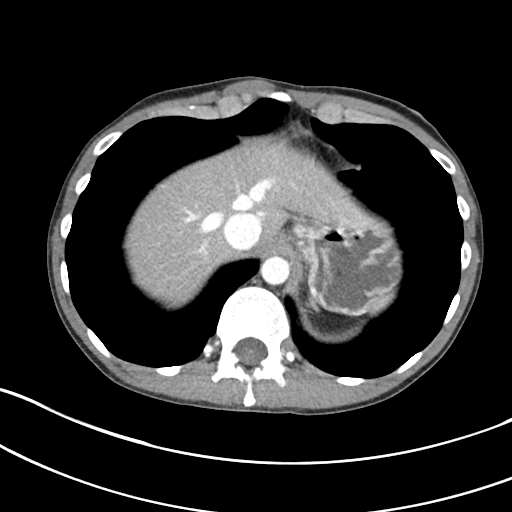
[im 74/85  lung]
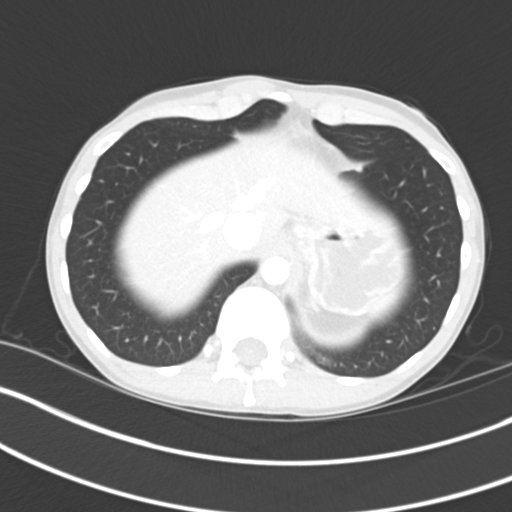

[Series 6: abdomen 3.0 mpr cor · coronal · 0.64mm/px · 3 of 74 slices shown]
[im 25/74  soft-tissue]
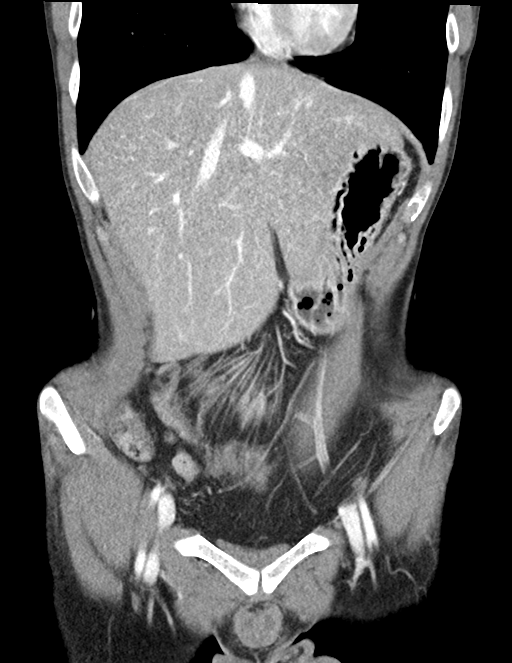
[im 33/74  soft-tissue]
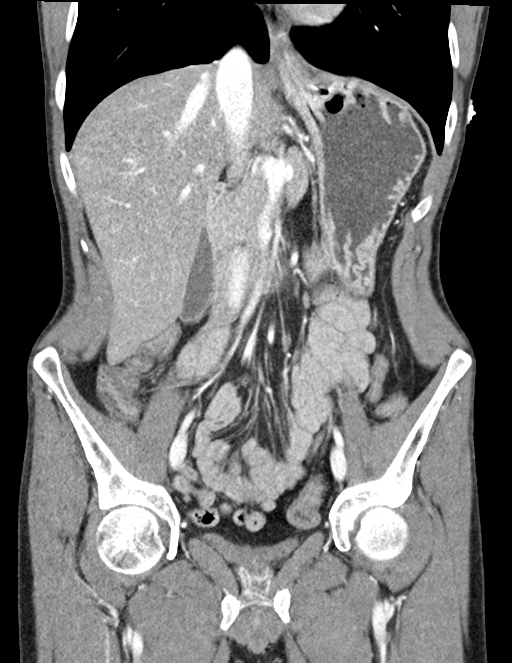
[im 41/74  soft-tissue]
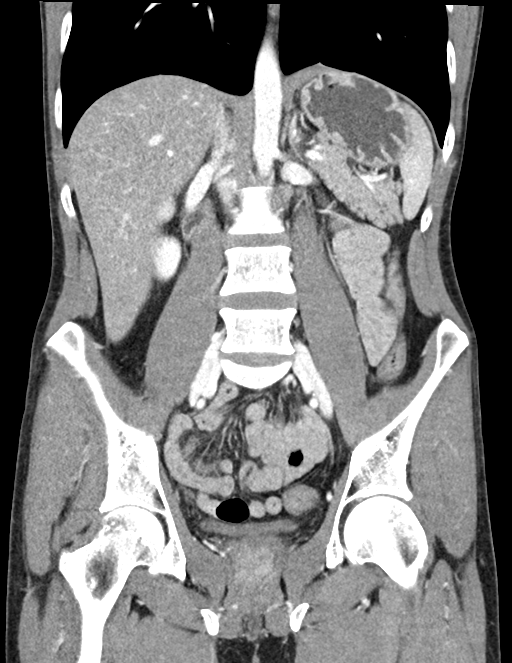

[Series 7: abdomen 3.0 mpr sag · sagittal · 0.43mm/px · 1 of 110 slices shown]
[im 37/110  soft-tissue]
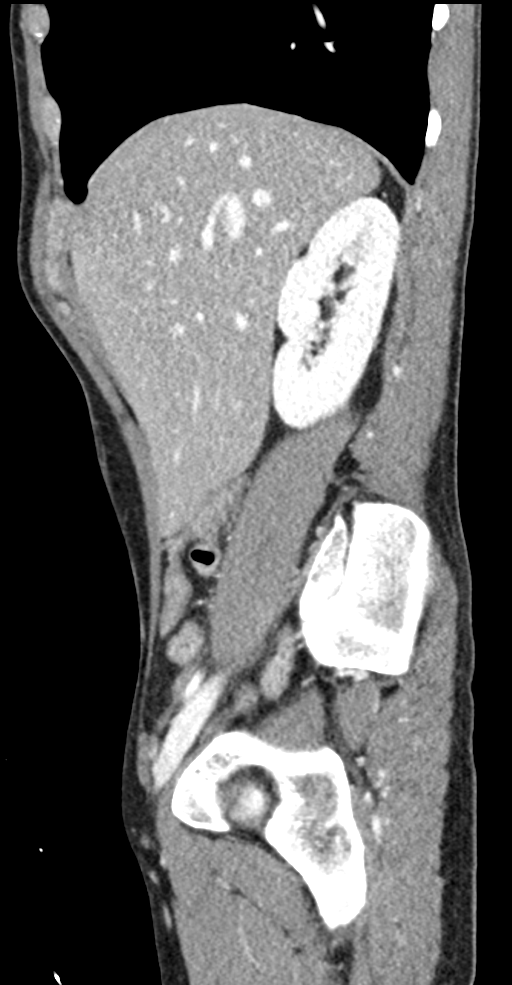

[11 of 46 positions shown; findings below may reference images not displayed]

RADIATION DOSE REDUCTION: This exam was performed according to the
departmental dose-optimization program which includes automated
exposure control, adjustment of the mA and/or kV according to
patient size and/or use of iterative reconstruction technique.

CONTRAST:  100mL OMNIPAQUE IOHEXOL 300 MG/ML  SOLN
FINDINGS: Lower chest: No acute abnormality.

Hepatobiliary: No focal liver abnormality is seen. Hepatic steatosis
is noted. No gallstones, gallbladder wall thickening, or biliary
dilatation.

Pancreas: Unremarkable. No pancreatic ductal dilatation or
surrounding inflammatory changes.

Spleen: Normal in size without focal abnormality.

Adrenals/Urinary Tract: The adrenal glands are within normal limits.
A nonobstructive calculus is present in the lower pole the left
kidney. The kidneys enhance symmetrically. A subcentimeter
hypodensity is noted in the lower pole of the left kidney which is
too small to further characterize. No hydronephrosis. The bladder is
decompressed.

Stomach/Bowel: There is thickening of the walls of the distal
esophagus and gastric rugae. No bowel obstruction, free air, or
pneumatosis. Mild diffuse colonic wall thickening is noted. A normal
appendix is present in the right lower quadrant.

Vascular/Lymphatic: No significant vascular findings are present. No
enlarged abdominal or pelvic lymph nodes.

Reproductive: Prostate is unremarkable.

Other: No abdominal wall hernia or abnormality. No abdominopelvic
ascites.

Musculoskeletal: No acute osseous abnormality.
IMPRESSION: 1. Thickening of the walls of the distal esophagus and gastric rugae
suggesting gastritis/esophagitis.
2. Mild diffuse colonic wall thickening, possible colitis.
3. Nonobstructive left renal calculus.
4. Hepatic steatosis.

## 2023-01-15 ENCOUNTER — Encounter (HOSPITAL_COMMUNITY): Payer: Self-pay | Admitting: *Deleted

## 2023-01-15 ENCOUNTER — Other Ambulatory Visit: Payer: Self-pay

## 2023-01-15 ENCOUNTER — Ambulatory Visit (HOSPITAL_COMMUNITY)
Admission: EM | Admit: 2023-01-15 | Discharge: 2023-01-15 | Disposition: A | Discharge status: Home / Self Care | Payer: Self-pay | Attending: Family Medicine | Admitting: Family Medicine

## 2023-01-15 DIAGNOSIS — R251 Tremor, unspecified: Secondary | ICD-10-CM | POA: Insufficient documentation

## 2023-01-15 DIAGNOSIS — E876 Hypokalemia: Secondary | ICD-10-CM | POA: Insufficient documentation

## 2023-01-15 LAB — COMPREHENSIVE METABOLIC PANEL
ALT: 31 U/L (ref 0–44)
AST: 61 U/L — ABNORMAL HIGH (ref 15–41)
Albumin: 4 g/dL (ref 3.5–5.0)
Alkaline Phosphatase: 113 U/L (ref 38–126)
Anion gap: 21 — ABNORMAL HIGH (ref 5–15)
BUN: 12 mg/dL (ref 6–20)
CO2: 16 mmol/L — ABNORMAL LOW (ref 22–32)
Calcium: 9.4 mg/dL (ref 8.9–10.3)
Chloride: 102 mmol/L (ref 98–111)
Creatinine, Ser: 1.05 mg/dL (ref 0.61–1.24)
GFR, Estimated: >60 mL/min (ref >60)
Glucose, Bld: 83 mg/dL (ref 70–99)
Potassium: 4.8 mmol/L (ref 3.5–5.1)
Sodium: 139 mmol/L (ref 135–145)
Total Bilirubin: 1.8 mg/dL — ABNORMAL HIGH (ref 0.3–1.2)
Total Protein: 7.2 g/dL (ref 6.5–8.1)

## 2023-01-15 LAB — MAGNESIUM: Magnesium: 1.7 mg/dL (ref 1.7–2.4)

## 2023-01-15 LAB — CBG MONITORING, ED: Glucose-Capillary: 123 mg/dL — ABNORMAL HIGH (ref 70–99)

## 2023-01-15 NOTE — ED Notes (Signed)
Pt given discharge instructions and went over with patient, pt verbalized understanding instructions. Upon giving patient his discharge paperwork and informing patient that his work note was on the back of his packet, pt stood up and got out of wheelchair and ambulated out triage room through lobby and outside to his car with a steady gait.

## 2023-01-15 NOTE — ED Provider Notes (Signed)
[place]    CSN: [id number] Arrival date & time: 01/15/23  1021      History   Chief Complaint Chief Complaint  Patient presents with   Tremors    HPI Stephen [name] is a 32 y.o. male.   HPI Here for "seizures" He states that about a year ago he started having spells.  They consist of feeling dizzy and hard to think and his body jerks and tremors.  He does not lose consciousness.  He is not really postictal after; there is no bowel or bladder incontinence during these episodes.  He had about 5 yesterday.  In desperation to family members gabapentin.  That family member takes the gabapentin for seizures.  He feels that an hour it made him better.   No n/v/d. No f/c. No URI symptoms  Past Medical History:  Diagnosis Date   Depression     There are no problems to display for this patient.   Past Surgical History:  Procedure Laterality Date   WISDOM TOOTH EXTRACTION         Home Medications    Prior to Admission medications   Medication Sig Start Date End Date Taking? Authorizing Provider  chlordiazePOXIDE (LIBRIUM) 25 MG capsule 25mg  PO TID x 1D, then 25mg  PO BID X 1D, then 25mg  PO QD X 1D 10/28/22   Elgie Congo, MD  dicyclomine (BENTYL) 20 MG tablet Take 1 tablet (20 mg total) by mouth 3 (three) times daily as needed for spasms. 09/16/18   Nat Christen, MD  potassium chloride (KLOR-CON) 10 MEQ tablet Take 2 tablets (20 mEq total) by mouth daily for 7 days. 10/28/22 11/04/22  Elgie Congo, MD    Family History History reviewed. No pertinent family history.  Social History Social History   Tobacco Use   Smoking status: Every Day    Types: Cigars   Smokeless tobacco: Never  Vaping Use   Vaping Use: Never used  Substance Use Topics   Alcohol use: Yes    Alcohol/week: 3.0 standard drinks of alcohol    Types: 3 Cans of beer per week   Drug use: Yes    Types: Marijuana     Allergies   Patient has no known allergies.   Review  of Systems Review of Systems   Physical Exam Triage Vital Signs ED Triage Vitals [01/15/23 1030]  Enc Vitals Group     BP      Pulse      Resp      Temp      Temp src      SpO2      Weight      Height      Head Circumference      Peak Flow      Pain Score 0     Pain Loc      Pain Edu?      Excl. in [place]?    No data found.  Updated Vital Signs BP (!) 142/97 (BP Location: Left Arm)   Pulse (!) 145   Temp 97.6 F (36.4 C) (Oral)   Resp 19   SpO2 99%   Visual Acuity Right Eye Distance:   Left Eye Distance:   Bilateral Distance:    Right Eye Near:   Left Eye Near:    Bilateral Near:     Physical Exam Vitals reviewed.  Constitutional:      General: He is not in acute distress.    Appearance: He  is not ill-appearing, toxic-appearing or diaphoretic.  HENT:     Nose: Nose normal.     Mouth/Throat:     Mouth: Mucous membranes are moist.     Pharynx: No oropharyngeal exudate or posterior oropharyngeal erythema.  Eyes:     Extraocular Movements: Extraocular movements intact.     Conjunctiva/sclera: Conjunctivae normal.     Pupils: Pupils are equal, round, and reactive to light.  Cardiovascular:     Rate and Rhythm: Normal rate and regular rhythm.     Heart sounds: No murmur heard. Pulmonary:     Effort: Pulmonary effort is normal.     Breath sounds: Normal breath sounds.  Abdominal:     General: There is no distension.     Palpations: Abdomen is soft. There is no mass.     Tenderness: There is no abdominal tenderness. There is no guarding.  Musculoskeletal:     Cervical back: Neck supple.  Lymphadenopathy:     Cervical: No cervical adenopathy.  Skin:    Capillary Refill: Capillary refill takes less than 2 seconds.     Coloration: Skin is not jaundiced or pale.  Neurological:     General: No focal deficit present.     Mental Status: He is alert and oriented to person, place, and time.  Psychiatric:        Behavior: Behavior normal.      UC Treatments  / Results  Labs (all labs ordered are listed, but only abnormal results are displayed) Labs Reviewed  CBG MONITORING, ED - Abnormal; Notable for the following components:      Result Value   Glucose-Capillary 123 (*)    All other components within normal limits  COMPREHENSIVE METABOLIC PANEL  MAGNESIUM  CBG MONITORING, ED    EKG   Radiology No results found.  Procedures Procedures (including critical care time)  Medications Ordered in UC Medications - No data to display  Initial Impression / Assessment and Plan / UC Course  I have reviewed the triage vital signs and the nursing notes.  Pertinent labs & imaging results that were available during my care of the patient were reviewed by me and considered in my medical decision making (see chart for details).       All staff was doing his EKG he had one of the spells.  His eyes were open and he was able to answer questions.  Also staff reports that he was able to move his extremities to accommodate they are putting on EKG leads.  There was no bowel or bladder incontinence while this was going on.  Fingerstick sugar was 123.  EKG shows sinus tachycardia.  The patient states he is already been evaluated in the emergency room and was told to go see neurology.  He has not arranged to apply for Medicaid and so has not gone to see the neurologist.  He states he really would just like a work note for yesterday.  I reviewed labs from January in the emergency room.  Potassium at that time was 3.2 and magnesium was low at 1.6.  Total bilirubin was 2.5 and his liver enzymes were mildly elevated at 86 and 45.  White count was minimally elevated 11.2 and he was not anemic.  Alcohol level was negative.  In in January the CT was negative also  CMP is drawn to recheck his bilirubin and potassium and magnesium. Final Clinical Impressions(s) / UC Diagnoses   Final diagnoses:  Muscle tremor  Hypokalemia  Discharge Instructions       Your EKG was normal except it showed a fast heart rate.  Your blood sugar was 123, and not diagnostic of anything.   The last time you were in the emergency room your potassium and magnesium are low.  This can cause muscle tremors, so we are going to redraw blood to check that.  Staff will notify you if you need to take potassium and magnesium supplements.  You need to schedule with neurology, please apply for Medicaid to see if you can qualify for that.     ED Prescriptions   None    PDMP not reviewed this encounter.   Barrett Henle, MD 01/15/23 1046

## 2023-01-15 NOTE — ED Triage Notes (Addendum)
Pt arrived by car this morning alone to get a DR's note Pt reports he was having a seizure . Pt reports he gets numb ,tingly. Pt reports his muscles contort.for 15 mins. Pt reports he has not seen a DR seizures. A Family member gave him Gabapentin yesterday and felt better with in an [place].  Pt alert and talking at check in . Pt reports seizures started on year ago. Pt reports he went to ED and had CT scan ,blood work and was referred to neuro but does not have heath care.

## 2023-01-15 NOTE — Discharge Instructions (Signed)
Your EKG was normal except it showed a fast heart rate.  Your blood sugar was 123, and not diagnostic of anything.   The last time you were in the emergency room your potassium and magnesium are low.  This can cause muscle tremors, so we are going to redraw blood to check that.  Staff will notify you if you need to take potassium and magnesium supplements.  You need to schedule with neurology, please apply for Medicaid to see if you can qualify for that.

## 2023-08-01 ENCOUNTER — Inpatient Hospital Stay (HOSPITAL_COMMUNITY)
Admission: EM | Admit: 2023-08-01 | Discharge: 2023-08-06 | DRG: 101 | Disposition: A | Discharge status: Other Acute Inpatient Hospital | Payer: Medicaid Other | Attending: Internal Medicine | Admitting: Internal Medicine

## 2023-08-01 ENCOUNTER — Ambulatory Visit (HOSPITAL_COMMUNITY)
Admission: EM | Admit: 2023-08-01 | Discharge: 2023-08-01 | Disposition: A | Discharge status: Other Acute Inpatient Hospital | Payer: No Payment, Other | Attending: Mental Health | Admitting: Mental Health

## 2023-08-01 DIAGNOSIS — G4089 Other seizures: Principal | ICD-10-CM | POA: Diagnosis present

## 2023-08-01 DIAGNOSIS — Z79899 Other long term (current) drug therapy: Secondary | ICD-10-CM

## 2023-08-01 DIAGNOSIS — R45851 Suicidal ideations: Secondary | ICD-10-CM | POA: Diagnosis present

## 2023-08-01 DIAGNOSIS — R569 Unspecified convulsions: Secondary | ICD-10-CM | POA: Insufficient documentation

## 2023-08-01 DIAGNOSIS — E876 Hypokalemia: Secondary | ICD-10-CM | POA: Diagnosis present

## 2023-08-01 DIAGNOSIS — F10120 Alcohol abuse with intoxication, uncomplicated: Secondary | ICD-10-CM | POA: Diagnosis present

## 2023-08-01 DIAGNOSIS — F332 Major depressive disorder, recurrent severe without psychotic features: Secondary | ICD-10-CM | POA: Diagnosis present

## 2023-08-01 DIAGNOSIS — M6282 Rhabdomyolysis: Secondary | ICD-10-CM | POA: Diagnosis present

## 2023-08-01 DIAGNOSIS — E871 Hypo-osmolality and hyponatremia: Secondary | ICD-10-CM | POA: Diagnosis present

## 2023-08-01 DIAGNOSIS — E860 Dehydration: Secondary | ICD-10-CM | POA: Diagnosis present

## 2023-08-01 DIAGNOSIS — E8721 Acute metabolic acidosis: Secondary | ICD-10-CM | POA: Diagnosis present

## 2023-08-01 DIAGNOSIS — F10139 Alcohol abuse with withdrawal, unspecified: Secondary | ICD-10-CM | POA: Diagnosis present

## 2023-08-01 DIAGNOSIS — Z8669 Personal history of other diseases of the nervous system and sense organs: Secondary | ICD-10-CM

## 2023-08-01 DIAGNOSIS — F410 Panic disorder [episodic paroxysmal anxiety] without agoraphobia: Secondary | ICD-10-CM | POA: Diagnosis present

## 2023-08-01 DIAGNOSIS — Y908 Blood alcohol level of 240 mg/100 ml or more: Secondary | ICD-10-CM | POA: Diagnosis present

## 2023-08-01 DIAGNOSIS — Z818 Family history of other mental and behavioral disorders: Secondary | ICD-10-CM

## 2023-08-01 DIAGNOSIS — F10920 Alcohol use, unspecified with intoxication, uncomplicated: Secondary | ICD-10-CM

## 2023-08-01 DIAGNOSIS — F419 Anxiety disorder, unspecified: Secondary | ICD-10-CM | POA: Insufficient documentation

## 2023-08-01 DIAGNOSIS — D72829 Elevated white blood cell count, unspecified: Secondary | ICD-10-CM | POA: Diagnosis present

## 2023-08-01 DIAGNOSIS — F17290 Nicotine dependence, other tobacco product, uncomplicated: Secondary | ICD-10-CM | POA: Diagnosis present

## 2023-08-01 DIAGNOSIS — Z811 Family history of alcohol abuse and dependence: Secondary | ICD-10-CM

## 2023-08-01 HISTORY — DX: Unspecified convulsions: R56.9

## 2023-08-01 NOTE — ED Triage Notes (Signed)
Pt says he has had about 4 seizures today and one occurred at [facility] tonight. Pt says for the past several days, 1-3 seizures daily. He is taking gabapentin for the seizures. He now feels fatigued. He is alert and clear speech.

## 2023-08-01 NOTE — ED Triage Notes (Signed)
Pt arrives from [facility], via [facility], hx of seizures. Seizure tonight after checking in  Lasted about 15 minutes. Takes gabapentin for seizures, has not missed any doses. A/O x 4 when EMS arrived. C/o headache and feeling tired. 150/99, hr 120, rr 16, 100% RA, 87 cbg.
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
## 2023-08-01 NOTE — ED Provider Notes (Addendum)
Behavioral Health Urgent Care Medical Screening Exam  Patient Name: Stephen [name] MRN: [id number] Date of Evaluation: 08/01/23 Chief Complaint:  " Pt is unable to identify any".  Diagnosis:  Final diagnoses:  Status post seizure    History of Present illness: Stephen [name] is a 32 y.o. male. With psychiatric history of depression and suicide ideations, and medical history of seizures, who presented voluntarily to [facility] via [employer] after he was found at home during a wellness check unresponsive status post seizure.   Patient was seen face to face by this provider and chart reviewed.   On evaluation, patient is alert, at baseline, restless and cooperative. Speech is clear and coherent. Pt appears casually dressed. Eye contact is poor. Mood is anxious, affect is congruent with mood. Thought process is coherent and thought content is WDL. Pt currently denies SI/HI/AVH. There is no objective indication that the patient is responding to internal stimuli. No delusions elicited during this assessment.    On approach, patient is noted to be resting his head on the table in the evaluation room with his eyes closed. He struggled to sit up, appears very fatigued, and asked if he could lay down on the floor. He is encouraged to lay down on two seats pushed together instead of the floor.  He denies SI/HI/AVH, and is unable to give further information about the circumstances surrounding his [facility] visit. Patient currently laying down eyes closed. He is informed of recommendation to transfer to [facility] for medical clearance and higher level of care.   [facility] staff obtained collateral information from his wife via telephone and she reports " patient has ongoing suicidal ideations and tried to kill himself today with a box cutter and also stated he was going to hang himself". She also reports he experiences frequent seizure episodes at work and home, which he's not able to manage with gabapentin. He also consumes alcohol  daily.    Flowsheet Row ED from 01/15/2023 in [facility] at [facility] ED from 10/28/2022 in [facility] Emergency Department at [facility] ED from 03/30/2022 in [facility] Care at [facility] RISK CATEGORY No Risk No Risk No Risk       Psychiatric Specialty Exam  Presentation  General Appearance:Casual  Eye Contact:Poor  Speech:Clear and Coherent; Slow  Speech Volume:Decreased  Handedness:Right   Mood and Affect  Mood: Anxious  Affect: Congruent   Thought Process  Thought Processes: Coherent  Descriptions of Associations:Intact  Orientation:Full (Time, Place and Person)  Thought Content:WDL    Hallucinations:None  Ideas of Reference:None  Suicidal Thoughts:No  Homicidal Thoughts:No   Sensorium  Memory: Immediate Fair  Judgment: Intact  Insight: Present   Executive Functions  Concentration: Fair  Attention Span: Fair  Recall: [employer] of Knowledge: Fair  Language: Fair   Psychomotor Activity  Psychomotor Activity: Psychomotor Retardation   Assets  Assets: Desire for Improvement; Communication Skills   Sleep  Sleep: Poor  Number of hours: No data recorded  Physical Exam: Physical Exam Constitutional:      Appearance: He is ill-appearing.  HENT:     Right Ear: External ear normal.     Left Ear: External ear normal.  Eyes:     General:        Right eye: No discharge.        Left eye: No discharge.  Cardiovascular:     Rate and Rhythm: Tachycardia present.  Neurological:     Mental Status:  He is alert. Mental status is at baseline.  Psychiatric:        Attention and Perception: Attention and perception normal.        Mood and Affect: Mood is depressed. Affect is inappropriate.        Speech: Speech normal.        Behavior: Behavior is cooperative.        Thought Content: Thought content normal. Thought content is not paranoid or delusional. Thought content does not include  homicidal or suicidal ideation. Thought content does not include homicidal or suicidal plan.    Review of Systems  Constitutional:  Negative for chills, diaphoresis and fever.  HENT:  Negative for congestion.   Eyes:  Negative for discharge.  Respiratory:  Negative for cough, shortness of breath and wheezing.   Cardiovascular:  Negative for chest pain and palpitations.  Gastrointestinal:  Negative for diarrhea, nausea and vomiting.  Psychiatric/Behavioral:  The patient is nervous/anxious.    Blood pressure 138/70, pulse (!) 138, temperature 99.1 F (37.3 C), resp. rate 18, SpO2 100%. There is no height or weight on file to calculate BMI.  Musculoskeletal: Strength & Muscle Tone: decreased Gait & Station: unable to stand Patient leans:  Unable to sit up for long, prefers to lay down.   [facility] MSE Discharge Disposition for Follow up and Recommendations: Based on my evaluation the patient appears to have an emergency medical condition for which I recommend the patient be transferred to the emergency department for further evaluation.   Recommend transfer to the ED for medical clearance. Patient is s/p seizure, found unresponsive, currently ill appearing, with a flushed face and unable to sit up or talk for longer than a minute.  Collateral obtained by TTS staff from his wife via telephone and she reports patient is endorsing SI, multiple plans and actually attempted today. Patient denies when asked.   Recommend inpatient psychiatric admission for stabilization and treatment when medically stable. TTS triage completed at [facility].   I spoke to Dr Donnald Garre at [facility] and the provider had agreed to accept the patient.   EMTALA completed.   Mancel Bale, NP 08/01/2023, 10:11 PM

## 2023-08-01 NOTE — Progress Notes (Signed)
08/01/23 2050  Patient Reported Information  How Did You Hear About [place]? Legal System  What Is the Reason for Your Visit/Call Today? Pt reports, he believes is blood pressure is elevated which has been an issue for a while. Pt reports, passive suicidal ideations with no intent, plans or means. Pt reports, he has seizures at work and home which he's not able to manage with Gabapentin.  Pt's wife reports, the pt tried to kill himself today with a box cutter and told he he was going to hang himself. Pt denies, HI, AVH, self-injurious behaviors and access to weapoPt denies, HI, AVH, self-injurious behaviors and access to weapons.  How Long Has This Been Causing You Problems? > than 6 months  What Do You Feel Would Help You the Most Today? Stress Management;Medication(s);Treatment for Depression or other mood problem;Housing Assistance  Have You Recently Had Any Thoughts About Hurting Yourself? Yes  Are You Planning to Commit Suicide/Harm Yourself At This time? Yes (Per wife however pt denies.)  Have you Recently Had Thoughts About Hurting Someone Karolee Ohs? No  Are You Planning To Harm Someone At This Time? No  Explanation: Pt denies, HI.  Have You Used Any Alcohol or Drugs in the Past 24 Hours? No (Pt denies, however his wife reports he drinks daily.)  What Did You Use and How Much? Pt denies, however his wife reports he drinks daily.  Do You Currently Have a Therapist/Psychiatrist? No  Name of Therapist/Psychiatrist Pt denies, being linked to outpatient treatment.  Have You Been Recently Discharged From Any Office Practice or Programs? No  Explanation of Discharge From Practice/Program None.  CCA Screening Triage Referral Assessment  Type of Contact Face-to-Face  Location of Assessment [facility] Assessment Services  Provider location [facility] Assessment Services  Collateral Involvement Barbaraann Boys, wife, [phone].  Does Patient Have a Automotive engineer Guardian? No  Legal Guardian  Contact Information Pt is his own guardian.  Copy of Legal Guardianship Form in Chart  (Pt is his own guardian.)  Legal Guardian Notified of Arrival   (Pt is his own guardian.)  Legal Guardian Notified of Pending Discharge   (Pt is his own guardian.)  If Minor and Not Living with Parent(s), Who has Custody? Pt is an adult.  Is CPS involved or ever been involved? Never  Is APS involved or ever been involved? Never  Patient Determined To Be At Risk for Harm To Self or Others Based on Review of Patient Reported Information or Presenting Complaint? Yes, for Self-Harm  Method Plan with intent and identified person (Per wife however pt denies.)  Availability of Means Has close by (Per wife however pt denies.)  Intent Clearly intends on inflicting harm that could cause death (Per wife however pt denies.)  Notification Required No need or identified person  Additional Information for Danger to Others Potential  (Pt denies, HI.)  Additional Comments for Danger to Others Potential Pt denies, HI.  Are There Guns or Other Weapons in Your Home? Yes  Types of Guns/Weapons Per wife pt had a box cutter.  Are These Weapons Safely Secured? Yes  Who Could Verify You Are Able To Have These Secured: Pt's wife reports, she took the box cutter.  Do You Have any Outstanding Charges, Pending Court Dates, Parole/Probation? Pt denies, legal involvement.  Contacted To Inform of Risk of Harm To Self or Others: Family/Significant Other:  Does Patient Present under Involuntary Commitment? No  [place] of Residence [place]  Patient Currently  Receiving the Following Services: Not Receiving Services  Determination of Need Emergent (2 hours)  Options For Referral Inpatient Hospitalization     Determination of need: Routine.    Redmond Pulling, MS, [facility], [facility] Triage Specialist [phone]

## 2023-08-01 NOTE — Discharge Instructions (Signed)

## 2023-08-02 ENCOUNTER — Emergency Department (HOSPITAL_COMMUNITY): Payer: Medicaid Other

## 2023-08-02 ENCOUNTER — Inpatient Hospital Stay (HOSPITAL_COMMUNITY): Payer: Medicaid Other

## 2023-08-02 ENCOUNTER — Encounter (HOSPITAL_COMMUNITY): Payer: Self-pay | Admitting: *Deleted

## 2023-08-02 ENCOUNTER — Other Ambulatory Visit: Payer: Self-pay

## 2023-08-02 DIAGNOSIS — E876 Hypokalemia: Secondary | ICD-10-CM | POA: Diagnosis present

## 2023-08-02 DIAGNOSIS — R569 Unspecified convulsions: Principal | ICD-10-CM

## 2023-08-02 DIAGNOSIS — D72829 Elevated white blood cell count, unspecified: Secondary | ICD-10-CM | POA: Diagnosis present

## 2023-08-02 DIAGNOSIS — G4089 Other seizures: Secondary | ICD-10-CM | POA: Diagnosis present

## 2023-08-02 DIAGNOSIS — F445 Conversion disorder with seizures or convulsions: Secondary | ICD-10-CM

## 2023-08-02 DIAGNOSIS — F10139 Alcohol abuse with withdrawal, unspecified: Secondary | ICD-10-CM | POA: Diagnosis present

## 2023-08-02 DIAGNOSIS — Z79899 Other long term (current) drug therapy: Secondary | ICD-10-CM | POA: Diagnosis not present

## 2023-08-02 DIAGNOSIS — F10120 Alcohol abuse with intoxication, uncomplicated: Secondary | ICD-10-CM | POA: Diagnosis present

## 2023-08-02 DIAGNOSIS — F17290 Nicotine dependence, other tobacco product, uncomplicated: Secondary | ICD-10-CM | POA: Diagnosis present

## 2023-08-02 DIAGNOSIS — E8721 Acute metabolic acidosis: Secondary | ICD-10-CM | POA: Diagnosis present

## 2023-08-02 DIAGNOSIS — Z811 Family history of alcohol abuse and dependence: Secondary | ICD-10-CM | POA: Diagnosis not present

## 2023-08-02 DIAGNOSIS — Y908 Blood alcohol level of 240 mg/100 ml or more: Secondary | ICD-10-CM | POA: Diagnosis present

## 2023-08-02 DIAGNOSIS — M6282 Rhabdomyolysis: Secondary | ICD-10-CM | POA: Diagnosis present

## 2023-08-02 DIAGNOSIS — F332 Major depressive disorder, recurrent severe without psychotic features: Secondary | ICD-10-CM

## 2023-08-02 DIAGNOSIS — Z818 Family history of other mental and behavioral disorders: Secondary | ICD-10-CM | POA: Diagnosis not present

## 2023-08-02 DIAGNOSIS — E860 Dehydration: Secondary | ICD-10-CM | POA: Diagnosis present

## 2023-08-02 DIAGNOSIS — R45851 Suicidal ideations: Secondary | ICD-10-CM | POA: Diagnosis present

## 2023-08-02 DIAGNOSIS — E871 Hypo-osmolality and hyponatremia: Secondary | ICD-10-CM | POA: Diagnosis present

## 2023-08-02 DIAGNOSIS — F410 Panic disorder [episodic paroxysmal anxiety] without agoraphobia: Secondary | ICD-10-CM | POA: Diagnosis present

## 2023-08-02 LAB — COMPREHENSIVE METABOLIC PANEL
ALT: 186 U/L — ABNORMAL HIGH (ref 0–44)
ALT: 161 U/L — ABNORMAL HIGH (ref 0–44)
AST: 356 U/L — ABNORMAL HIGH (ref 15–41)
AST: 312 U/L — ABNORMAL HIGH (ref 15–41)
Albumin: 4.6 g/dL (ref 3.5–5.0)
Albumin: 3.7 g/dL (ref 3.5–5.0)
Alkaline Phosphatase: 112 U/L (ref 38–126)
Alkaline Phosphatase: 94 U/L (ref 38–126)
Anion gap: 22 — ABNORMAL HIGH (ref 5–15)
Anion gap: 18 — ABNORMAL HIGH (ref 5–15)
BUN: 11 mg/dL (ref 6–20)
BUN: 11 mg/dL (ref 6–20)
CO2: 18 mmol/L — ABNORMAL LOW (ref 22–32)
CO2: 21 mmol/L — ABNORMAL LOW (ref 22–32)
Calcium: 9.4 mg/dL (ref 8.9–10.3)
Calcium: 8.6 mg/dL — ABNORMAL LOW (ref 8.9–10.3)
Chloride: 93 mmol/L — ABNORMAL LOW (ref 98–111)
Chloride: 93 mmol/L — ABNORMAL LOW (ref 98–111)
Creatinine, Ser: 0.71 mg/dL (ref 0.61–1.24)
Creatinine, Ser: 0.78 mg/dL (ref 0.61–1.24)
GFR, Estimated: >60 mL/min (ref >60)
GFR, Estimated: >60 mL/min (ref >60)
Glucose, Bld: 74 mg/dL (ref 70–99)
Glucose, Bld: 105 mg/dL — ABNORMAL HIGH (ref 70–99)
Potassium: 3.8 mmol/L (ref 3.5–5.1)
Potassium: 3.7 mmol/L (ref 3.5–5.1)
Sodium: 133 mmol/L — ABNORMAL LOW (ref 135–145)
Sodium: 132 mmol/L — ABNORMAL LOW (ref 135–145)
Total Bilirubin: 0.9 mg/dL (ref 0.3–1.2)
Total Bilirubin: 0.5 mg/dL (ref 0.3–1.2)
Total Protein: 7.9 g/dL (ref 6.5–8.1)
Total Protein: 6.3 g/dL — ABNORMAL LOW (ref 6.5–8.1)

## 2023-08-02 LAB — CBC
HCT: 46 % (ref 39.0–52.0)
Hemoglobin: 16.4 g/dL (ref 13.0–17.0)
MCH: 33.5 pg (ref 26.0–34.0)
MCHC: 35.7 g/dL (ref 30.0–36.0)
MCV: 93.9 fL (ref 80.0–100.0)
Platelets: 388 10*3/uL (ref 150–400)
RBC: 4.9 MIL/uL (ref 4.22–5.81)
RDW: 12 % (ref 11.5–15.5)
WBC: 14.9 10*3/uL — ABNORMAL HIGH (ref 4.0–10.5)
nRBC: 0 % (ref 0.0–0.2)

## 2023-08-02 LAB — TROPONIN I (HIGH SENSITIVITY)
Troponin I (High Sensitivity): 16 ng/L (ref <18)
Troponin I (High Sensitivity): 21 ng/L — ABNORMAL HIGH (ref <18)

## 2023-08-02 LAB — ACETAMINOPHEN LEVEL: Acetaminophen (Tylenol), Serum: <10 ug/mL — ABNORMAL LOW (ref 10–30)

## 2023-08-02 LAB — RAPID URINE DRUG SCREEN, HOSP PERFORMED
Amphetamines: NOT DETECTED
Barbiturates: NOT DETECTED
Benzodiazepines: NOT DETECTED
Cocaine: NOT DETECTED
Opiates: NOT DETECTED
Tetrahydrocannabinol: POSITIVE — AB

## 2023-08-02 LAB — CK: Total CK: 4639 U/L — ABNORMAL HIGH (ref 49–397)

## 2023-08-02 LAB — SALICYLATE LEVEL: Salicylate Lvl: <7 mg/dL — ABNORMAL LOW (ref 7.0–30.0)

## 2023-08-02 LAB — MAGNESIUM: Magnesium: 2.1 mg/dL (ref 1.7–2.4)

## 2023-08-02 LAB — ETHANOL: Alcohol, Ethyl (B): 248 mg/dL — ABNORMAL HIGH (ref <10)

## 2023-08-02 MED ORDER — THIAMINE HCL 100 MG/ML IJ SOLN: 100.0000 mg | Freq: Every day | INTRAMUSCULAR | Status: DC

## 2023-08-02 MED ORDER — ONDANSETRON HCL 4 MG/2ML IJ SOLN: 4.0000 mg | Freq: Four times a day (QID) | INTRAMUSCULAR | Status: DC | PRN

## 2023-08-02 MED ORDER — FOLIC ACID 1 MG PO TABS
1.0000 mg | ORAL_TABLET | Freq: Every day | ORAL | Status: DC
  Administered 2023-08-02 – 2023-08-06 (×5): 1 mg via ORAL
  Filled 2023-08-02 (×5): qty 1

## 2023-08-02 MED ORDER — LORAZEPAM 2 MG/ML IJ SOLN
0.0000 mg | Freq: Two times a day (BID) | INTRAMUSCULAR | Status: AC
  Administered 2023-08-04 – 2023-08-05 (×3): 1 mg via INTRAVENOUS
  Administered 2023-08-06: 2 mg via INTRAVENOUS
  Filled 2023-08-02 (×5): qty 1

## 2023-08-02 MED ORDER — THIAMINE MONONITRATE 100 MG PO TABS
100.0000 mg | ORAL_TABLET | Freq: Every day | ORAL | Status: DC
  Administered 2023-08-02 – 2023-08-06 (×5): 100 mg via ORAL
  Filled 2023-08-02 (×5): qty 1

## 2023-08-02 MED ORDER — ONDANSETRON HCL 4 MG PO TABS: 4.0000 mg | ORAL_TABLET | Freq: Four times a day (QID) | ORAL | Status: DC | PRN

## 2023-08-02 MED ORDER — DEXTROSE-SODIUM CHLORIDE 5-0.9 % IV SOLN: INTRAVENOUS | Status: AC

## 2023-08-02 MED ORDER — LORAZEPAM 2 MG/ML IJ SOLN
0.0000 mg | Freq: Four times a day (QID) | INTRAMUSCULAR | Status: AC
  Administered 2023-08-02 (×3): 1 mg via INTRAVENOUS
  Filled 2023-08-02 (×3): qty 1

## 2023-08-02 MED ORDER — LACTATED RINGERS IV BOLUS
1000.0000 mL | Freq: Once | INTRAVENOUS | Status: AC
  Administered 2023-08-02: 1000 mL via INTRAVENOUS

## 2023-08-02 MED ORDER — LEVETIRACETAM IN NACL 500 MG/100ML IV SOLN
500.0000 mg | Freq: Two times a day (BID) | INTRAVENOUS | Status: DC
  Administered 2023-08-02 – 2023-08-03 (×3): 500 mg via INTRAVENOUS
  Filled 2023-08-02 (×3): qty 100

## 2023-08-02 MED ORDER — ADULT MULTIVITAMIN W/MINERALS CH
1.0000 | ORAL_TABLET | Freq: Every day | ORAL | Status: DC
  Administered 2023-08-02 – 2023-08-06 (×5): 1 via ORAL
  Filled 2023-08-02 (×5): qty 1

## 2023-08-02 MED ORDER — LEVETIRACETAM IN NACL 1000 MG/100ML IV SOLN
1000.0000 mg | Freq: Once | INTRAVENOUS | Status: AC
  Administered 2023-08-02: 1000 mg via INTRAVENOUS
  Filled 2023-08-02: qty 100

## 2023-08-02 MED ORDER — LORAZEPAM 1 MG PO TABS
1.0000 mg | ORAL_TABLET | ORAL | Status: AC | PRN
  Administered 2023-08-03 (×3): 1 mg via ORAL
  Filled 2023-08-02 (×3): qty 1

## 2023-08-02 MED ORDER — ENOXAPARIN SODIUM 40 MG/0.4ML IJ SOSY
40.0000 mg | PREFILLED_SYRINGE | INTRAMUSCULAR | Status: DC
  Administered 2023-08-02: 40 mg via SUBCUTANEOUS
  Filled 2023-08-02 (×3): qty 0.4

## 2023-08-02 MED ORDER — IBUPROFEN 400 MG PO TABS: 400.0000 mg | ORAL_TABLET | Freq: Four times a day (QID) | ORAL | Status: DC | PRN

## 2023-08-02 MED ORDER — LORAZEPAM 2 MG/ML IJ SOLN: 1.0000 mg | INTRAMUSCULAR | Status: AC | PRN

## 2023-08-02 NOTE — Progress Notes (Signed)
Per Thereasa Distance pt will be admitted inpatient at [facility]. Bed offer at [facility] has been resinded due to medical admission per [facility], RN. This CSW will now remove this pt from the TTS/ BH shift report. Inpatient Psych consult team to assist with psych needs.   Care Team notified: Day [facility] Kae Heller, Lauren Chical, Paramedic,Gabriela Caren Macadam Cunningham,NT, [address] [place], PMHNP, Patty Dowd,RN    Maryjean Ka, MSW, LCSWA 08/02/2023 1:16 PM

## 2023-08-02 NOTE — Progress Notes (Signed)
Patient bed at [facility] rescinded at this time

## 2023-08-02 NOTE — ED Notes (Signed)
SW Stephen [name] who advised that he was having small seizures and was found unresponsive after a family member completed a wellness check.  Pt stated Law Enforcement thought it would be a [name] idea to get checked out. Pt lives with his 31 year old grandmother, 3 fur babies and Uncle.    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  Pt also spoke of his 11 year relationship with Stephen [name] who he refers to as his wife.  Pt recommended Stephen [name] and his Stephen [name] for collateral, but noted that Stephen [name] works 2 jobs and would not be available.  Aunt Stephen [name] was called but no response as of yet.   Stephen [name], [facility]

## 2023-08-02 NOTE — ED Notes (Signed)
ED TO INPATIENT HANDOFF REPORT  Name/Age/Gender Stephen [name] 32 y.o. male  Code Status    Code Status Orders  (From admission, onward)           Start     Ordered   08/02/23 0842  Full code  Continuous       Question:  By:  Answer:  Consent: discussion documented in EHR   08/02/23 0843           Code Status History     Date Active Date Inactive Code Status Order ID Comments User Context   09/14/2011 1754 09/15/2011 0035 Full Code [id number]  Glynn Octave, MD ED       Home/SNF/Other [facility]  Chief Complaint Seizure [facility]) [R56.9]  Level of Care/Admitting Diagnosis ED Disposition     ED Disposition  Admit   Condition  --   Comment  Hospital Area: [facility] [[id number]]  Level of Care: Telemetry Medical [104]  May admit patient to [facility] if equivalent level of care is available:: No  Covid Evaluation: Asymptomatic - no recent exposure (last 10 days) testing not required  Diagnosis: Seizure [facility]) [205090]  Admitting Physician: Glade Lloyd [[id number]]  Attending Physician: Glade Lloyd [[id number]]  Certification:: I certify this patient will need inpatient services for at least 2 midnights  Expected Medical Readiness: 08/04/2023          Medical History Past Medical History:  Diagnosis Date   Depression    Seizures (HCC)     Allergies No Known Allergies  IV Location/Drains/Wounds Patient Lines/Drains/Airways Status     Active Line/Drains/Airways     Name Placement date Placement time Site Days   Peripheral IV 08/02/23 18 G Left;Lateral Forearm 08/02/23  0203  Forearm  less than 1            Labs/Imaging Results for orders placed or performed during the hospital encounter of 08/01/23 (from the past 48 hour(s))  Comprehensive metabolic panel     Status: Abnormal   Collection Time: 08/02/23 12:38 AM  Result Value Ref Range   Sodium 133 (L) 135 - 145 mmol/L   Potassium 3.8 3.5 - 5.1 mmol/L   Chloride  93 (L) 98 - 111 mmol/L   CO2 18 (L) 22 - 32 mmol/L   Glucose, Bld 74 70 - 99 mg/dL    Comment: Glucose reference range applies only to samples taken after fasting for at least 8 hours.   BUN 11 6 - 20 mg/dL   Creatinine, Ser 8.41 0.61 - 1.24 mg/dL   Calcium 9.4 8.9 - 32.4 mg/dL   Total Protein 7.9 6.5 - 8.1 g/dL   Albumin 4.6 3.5 - 5.0 g/dL   AST 401 (H) 15 - 41 U/L   ALT 186 (H) 0 - 44 U/L   Alkaline Phosphatase 112 38 - 126 U/L   Total Bilirubin 0.9 0.3 - 1.2 mg/dL   GFR, Estimated >02 >72 mL/min    Comment: (NOTE) Calculated using the CKD-EPI Creatinine Equation (2021)    Anion gap 22 (H) 5 - 15    Comment: ELECTROLYTES REPEATED TO VERIFY Performed at [facility], [address], [place], [place] [address]   Ethanol     Status: Abnormal   Collection Time: 08/02/23 12:38 AM  Result Value Ref Range   Alcohol, Ethyl (B) 248 (H) <10 mg/dL    Comment: (NOTE) Lowest detectable limit for serum alcohol is 10 mg/dL.  For medical purposes  only. Performed at [facility], [address], [place], [place] [address]   Salicylate level     Status: Abnormal   Collection Time: 08/02/23 12:38 AM  Result Value Ref Range   Salicylate Lvl <7.0 (L) 7.0 - 30.0 mg/dL    Comment: Performed at [facility], [address], [place], [place] [address]  Acetaminophen level     Status: Abnormal   Collection Time: 08/02/23 12:38 AM  Result Value Ref Range   Acetaminophen (Tylenol), Serum <10 (L) 10 - 30 ug/mL    Comment: (NOTE) Therapeutic concentrations vary significantly. A range of 10-30 ug/mL  may be an effective concentration for many patients. However, some  are best treated at concentrations outside of this range. Acetaminophen concentrations >150 ug/mL at 4 hours after ingestion  and >50 ug/mL at 12 hours after ingestion are often associated with  toxic reactions.  Performed at [facility], [address] [place], [place] [address]   cbc     Status: Abnormal   Collection Time: 08/02/23 12:38 AM  Result Value Ref Range   WBC 14.9 (H) 4.0 - 10.5 K/uL   RBC 4.90 4.22 - 5.81 MIL/uL   Hemoglobin 16.4 13.0 - 17.0 g/dL   HCT 95.2 84.1 - 32.4 %   MCV 93.9 80.0 - 100.0 fL   MCH 33.5 26.0 - 34.0 pg   MCHC 35.7 30.0 - 36.0 g/dL   RDW 40.1 02.7 - 25.3 %   Platelets 388 150 - 400 K/uL   nRBC 0.0 0.0 - 0.2 %    Comment: Performed at [facility], [address] [place], [place] [address]  Magnesium     Status: None   Collection Time: 08/02/23 12:38 AM  Result Value Ref Range   Magnesium 2.1 1.7 - 2.4 mg/dL    Comment: Performed at [facility], [address] [place], [place] [address]  Troponin I (High Sensitivity)     Status: None   Collection Time: 08/02/23 12:38 AM  Result Value Ref Range   Troponin I (High Sensitivity) 16 <18 ng/L    Comment: (NOTE) Elevated high sensitivity troponin I (hsTnI) values and significant  changes across serial measurements may suggest ACS but many other  chronic and acute conditions are known to elevate hsTnI results.  Refer to the "Links" section for chest pain algorithms and additional  guidance. Performed at [facility], [address], [place], [place] [address]   CK     Status: Abnormal   Collection Time: 08/02/23 12:38 AM  Result Value Ref Range   Total CK 4,639 (H) 49 - 397 U/L    Comment: RESULT CONFIRMED BY MANUAL DILUTION Performed at [facility], [address] [place], [place] [address]   Rapid urine drug screen (hospital performed)     Status: Abnormal   Collection Time: 08/02/23  2:01 AM  Result Value Ref Range   Opiates NONE DETECTED NONE DETECTED   Cocaine NONE DETECTED NONE DETECTED   Benzodiazepines NONE DETECTED NONE DETECTED   Amphetamines NONE DETECTED NONE DETECTED   Tetrahydrocannabinol POSITIVE (A) NONE DETECTED   Barbiturates NONE DETECTED NONE  DETECTED    Comment: (NOTE) DRUG SCREEN FOR MEDICAL PURPOSES ONLY.  IF CONFIRMATION IS NEEDED FOR ANY PURPOSE, NOTIFY LAB WITHIN 5 DAYS.  LOWEST DETECTABLE LIMITS FOR URINE DRUG SCREEN Drug Class  Cutoff (ng/mL) Amphetamine and metabolites    1000 Barbiturate and metabolites    200 Benzodiazepine                 200 Opiates and metabolites        300 Cocaine and metabolites        300 THC                            50 Performed at [facility], [address], [place], [place] [address]   Troponin I (High Sensitivity)     Status: Abnormal   Collection Time: 08/02/23  4:00 AM  Result Value Ref Range   Troponin I (High Sensitivity) 21 (H) <18 ng/L    Comment: (NOTE) Elevated high sensitivity troponin I (hsTnI) values and significant  changes across serial measurements may suggest ACS but many other  chronic and acute conditions are known to elevate hsTnI results.  Refer to the "Links" section for chest pain algorithms and additional  guidance. Performed at [facility], [address] [place], [place] [address]   Comprehensive metabolic panel     Status: Abnormal   Collection Time: 08/02/23  4:00 AM  Result Value Ref Range   Sodium 132 (L) 135 - 145 mmol/L   Potassium 3.7 3.5 - 5.1 mmol/L   Chloride 93 (L) 98 - 111 mmol/L   CO2 21 (L) 22 - 32 mmol/L   Glucose, Bld 105 (H) 70 - 99 mg/dL    Comment: Glucose reference range applies only to samples taken after fasting for at least 8 hours.   BUN 11 6 - 20 mg/dL   Creatinine, Ser 1.30 0.61 - 1.24 mg/dL   Calcium 8.6 (L) 8.9 - 10.3 mg/dL   Total Protein 6.3 (L) 6.5 - 8.1 g/dL   Albumin 3.7 3.5 - 5.0 g/dL   AST 865 (H) 15 - 41 U/L   ALT 161 (H) 0 - 44 U/L   Alkaline Phosphatase 94 38 - 126 U/L   Total Bilirubin 0.5 0.3 - 1.2 mg/dL   GFR, Estimated >78 >46 mL/min    Comment: (NOTE) Calculated using the CKD-EPI Creatinine Equation (2021)    Anion gap 18 (H) 5 - 15     Comment: Performed at [facility], [address], [place], [place] [address]   CT Head Wo Contrast  Result Date: 08/02/2023 CLINICAL DATA:  Seizure.  Headache EXAM: CT HEAD WITHOUT CONTRAST TECHNIQUE: Contiguous axial images were obtained from the base of the skull through the vertex without intravenous contrast. RADIATION DOSE REDUCTION: This exam was performed according to the departmental dose-optimization program which includes automated exposure control, adjustment of the mA and/or kV according to patient size and/or use of iterative reconstruction technique. COMPARISON:  10/28/2022 FINDINGS: Brain: No acute intracranial abnormality. Specifically, no hemorrhage, hydrocephalus, mass lesion, acute infarction, or significant intracranial injury. Vascular: No hyperdense vessel or unexpected calcification. Skull: No acute calvarial abnormality. Sinuses/Orbits: No acute findings Other: None IMPRESSION: Normal study. Electronically Signed   By: Charlett Nose M.D.   On: 08/02/2023 02:47    Pending Labs Unresulted Labs (From admission, onward)     Start     Ordered   08/03/23 0500  HIV Antibody (routine testing w rflx)  (HIV Antibody (Routine testing w reflex) panel)  Tomorrow morning,   R        08/02/23 0843   08/03/23 0500  CBC  Tomorrow morning,   R        08/02/23 0843   08/03/23 0500  Comprehensive metabolic panel  Tomorrow morning,   R        08/02/23 0843   08/03/23 0500  Magnesium  Tomorrow morning,   R        08/02/23 0843   08/03/23 0500  CK  Tomorrow morning,   R        08/02/23 0843            Vitals/Pain Today's Vitals   08/02/23 0410 08/02/23 0545 08/02/23 1200 08/02/23 1500  BP:  126/75 127/86 129/85  Pulse: (!) 121 (!) 107 (!) 102 88  Resp: 16 16 (!) 34 (!) 24  Temp:      TempSrc:      SpO2: 99% 99% 98% 94%  PainSc:        Isolation Precautions No active isolations  Medications Medications  LORazepam (ATIVAN) tablet 1-4 mg (has no  administration in time range)    Or  LORazepam (ATIVAN) injection 1-4 mg (has no administration in time range)  thiamine (VITAMIN B1) tablet 100 mg (100 mg Oral Given 08/02/23 1022)    Or  thiamine (VITAMIN B1) injection 100 mg ( Intravenous See Alternative 08/02/23 1022)  folic acid (FOLVITE) tablet 1 mg (1 mg Oral Given 08/02/23 1022)  multivitamin with minerals tablet 1 tablet (1 tablet Oral Given 08/02/23 1022)  LORazepam (ATIVAN) injection 0-4 mg (1 mg Intravenous Given 08/02/23 0730)    Followed by  LORazepam (ATIVAN) injection 0-4 mg (has no administration in time range)  dextrose 5 %-0.9 % sodium chloride infusion (0 mLs Intravenous Stopped 08/02/23 1205)  levETIRAcetam (KEPPRA) IVPB 500 mg/100 mL premix (0 mg Intravenous Stopped 08/02/23 0731)  enoxaparin (LOVENOX) injection 40 mg (40 mg Subcutaneous Given 08/02/23 1022)  ondansetron (ZOFRAN) tablet 4 mg (has no administration in time range)    Or  ondansetron (ZOFRAN) injection 4 mg (has no administration in time range)  ibuprofen (ADVIL) tablet 400 mg (has no administration in time range)  lactated ringers bolus 1,000 mL (0 mLs Intravenous Stopped 08/02/23 0354)  lactated ringers bolus 1,000 mL (0 mLs Intravenous Stopped 08/02/23 0354)  levETIRAcetam (KEPPRA) IVPB 1000 mg/100 mL premix (0 mg Intravenous Stopped 08/02/23 0414)    Mobility walks

## 2023-08-02 NOTE — Progress Notes (Signed)
LTM EEG hooked up and running - no initial skin breakdown - push button tested - [facility] monitoring.  

## 2023-08-02 NOTE — Consult Note (Signed)
BH ED ASSESSMENT    Reason for Consult:  SI Referring Physician: Dr. Hanley Ben Patient Identification: PERLEY MCCLEMENTS MRN:  [id number] ED Chief Complaint: MDD (major depressive disorder), recurrent severe, without psychosis (HCC)  Diagnosis:  Principal Problem:   MDD (major depressive disorder), recurrent severe, without psychosis (HCC) Active Problems:   Seizure (HCC)   Suicidal ideation   ED Assessment Time Calculation: Start Time: 1100 Stop Time: 1140 Total Time in Minutes (Assessment Completion): 40   Subjective:  OZIL WADLER is a 32 y.o. male patient arrives from [facility], via [facility], hx of seizures. Seizure tonight after checking in Lasted about 15 minutes. Takes gabapentin for seizures, has not missed any doses. A/O x 4 when EMS arrived.     HPI: EMARI SIBBETT, 32 y.o., male patient seen face to face by this provider, consulted with Dr. Lucianne Muss; and chart reviewed on 08/02/23.  On evaluation JORREL CRIPE reports he had a small series of seizures and a family member was concerned and and they called law enforcement to do a welfare check on patient. Patient states he lives with his 46 year old grandmother who has severe dementia, and he also lives with his uncle. Patient then states he was handcuffed by the sheriff to come to the hospital, and before he was handcuffed he states he drank a couple shots of liquor. Patient states he started having seizures about 2 years now and unsure what precipitates them. He states he has some stress at work, but would not elaborate. He denies SI/HI/AVH, states his last hospitalization was about 20 years ago at age 12, when his parents divorced and he was feeling depressed and stressed. Patient states he does not take any psychiatric medications. He states the last time he took psychiatric medications was Prozac and was about 10 years ago. His UDS was positive for THC and BAL was 248, he denies using any other illicit substances. He states he had a past history  of alcohol abuse but currently does not think its an issue, and states he has never done AA or detox for alcohol. Patient states his appetite and sleep are fair. He states he isolates, does not like drama, he stays at home and plays video games or spends time with his girlfriend. Patient states he has never had therapy.   During evaluation AHAMAD SUMMAR is laying in hospital bed, appears to be in no acute distress. He is alert, oriented x 4, calm, cooperative and attentive. His mood is euthymic with congruent/ flat affect. He has normal speech, and behavior.  Objectively there is no evidence of psychosis/mania or delusional thinking.  Patient is able to converse coherently, goal directed thoughts, no distractibility, or pre-occupation. He denies suicidal/self-harm/homicidal ideation, psychosis, and paranoia. Patient appears to have no insight on situation, and minimizes why he is here in the hospital. Patient did not discuss with provider that he was seen in urgent care earlier with concern for suicidal ideation and cutting himself with a box cutter. He now denies any suicidal thoughts or homicidal thoughts.   Past Psychiatric History: anxiety, depression   Risk to Self or Others: Risk to Self: Yes Risk to Others:  No  Prior Inpatient Therapy: Yes  Prior Outpatient Therapy:  No   [place] Scale:  Flowsheet Row ED to Hosp-Admission (Current) from 08/01/2023 in [facility] Emergency Department at [facility] Most recent reading at 08/02/2023  7:24 AM ED from 08/01/2023 in [facility] Most recent  reading at 08/01/2023 11:13 PM ED from 01/15/2023 in [facility] Care at [facility] Most recent reading at 01/15/2023 10:31 AM  C-SSRS RISK CATEGORY Moderate Risk High Risk No Risk       AIMS:  , , ,  ,   ASAM:    Substance Abuse:     Past Medical History:  Past Medical History:  Diagnosis Date   Depression    Seizures (HCC)     Past Surgical History:   Procedure Laterality Date   WISDOM TOOTH EXTRACTION     Family History: History reviewed. No pertinent family history.  Social History:  Social History   Substance and Sexual Activity  Alcohol Use Yes   Alcohol/week: 3.0 standard drinks of alcohol   Types: 3 Cans of beer per week     Social History   Substance and Sexual Activity  Drug Use Yes   Types: Marijuana    Social History   Socioeconomic History   Marital status: Single    Spouse name: Not on file   Number of children: Not on file   Years of education: Not on file   Highest education level: Not on file  Occupational History   Not on file  Tobacco Use   Smoking status: Every Day    Types: Cigars   Smokeless tobacco: Never  Vaping Use   Vaping status: Never Used  Substance and Sexual Activity   Alcohol use: Yes    Alcohol/week: 3.0 standard drinks of alcohol    Types: 3 Cans of beer per week   Drug use: Yes    Types: Marijuana   Sexual activity: Not on file  Other Topics Concern   Not on file  Social History Narrative   Not on file   Social Determinants of Health   Financial Resource Strain: Not on file  Food Insecurity: Not on file  Transportation Needs: Not on file  Physical Activity: Not on file  Stress: Not on file  Social Connections: Not on file   Additional Social History:    Allergies:  No Known Allergies  Labs:  Results for orders placed or performed during the hospital encounter of 08/01/23 (from the past 48 hour(s))  Comprehensive metabolic panel     Status: Abnormal   Collection Time: 08/02/23 12:38 AM  Result Value Ref Range   Sodium 133 (L) 135 - 145 mmol/L   Potassium 3.8 3.5 - 5.1 mmol/L   Chloride 93 (L) 98 - 111 mmol/L   CO2 18 (L) 22 - 32 mmol/L   Glucose, Bld 74 70 - 99 mg/dL    Comment: Glucose reference range applies only to samples taken after fasting for at least 8 hours.   BUN 11 6 - 20 mg/dL   Creatinine, Ser 1.61 0.61 - 1.24 mg/dL   Calcium 9.4 8.9 - 09.6  mg/dL   Total Protein 7.9 6.5 - 8.1 g/dL   Albumin 4.6 3.5 - 5.0 g/dL   AST 045 (H) 15 - 41 U/L   ALT 186 (H) 0 - 44 U/L   Alkaline Phosphatase 112 38 - 126 U/L   Total Bilirubin 0.9 0.3 - 1.2 mg/dL   GFR, Estimated >40 >98 mL/min    Comment: (NOTE) Calculated using the CKD-EPI Creatinine Equation (2021)    Anion gap 22 (H) 5 - 15    Comment: ELECTROLYTES REPEATED TO VERIFY Performed at [facility], [address], [place], [place] [address]   Ethanol  Status: Abnormal   Collection Time: 08/02/23 12:38 AM  Result Value Ref Range   Alcohol, Ethyl (B) 248 (H) <10 mg/dL    Comment: (NOTE) Lowest detectable limit for serum alcohol is 10 mg/dL.  For medical purposes only. Performed at [facility], [address] [place], [place] [address]   Salicylate level     Status: Abnormal   Collection Time: 08/02/23 12:38 AM  Result Value Ref Range   Salicylate Lvl <7.0 (L) 7.0 - 30.0 mg/dL    Comment: Performed at [facility], [address] [place], [place] [address]  Acetaminophen level     Status: Abnormal   Collection Time: 08/02/23 12:38 AM  Result Value Ref Range   Acetaminophen (Tylenol), Serum <10 (L) 10 - 30 ug/mL    Comment: (NOTE) Therapeutic concentrations vary significantly. A range of 10-30 ug/mL  may be an effective concentration for many patients. However, some  are best treated at concentrations outside of this range. Acetaminophen concentrations >150 ug/mL at 4 hours after ingestion  and >50 ug/mL at 12 hours after ingestion are often associated with  toxic reactions.  Performed at [facility], [address], [place], [place] [address]   cbc     Status: Abnormal   Collection Time: 08/02/23 12:38 AM  Result Value Ref Range   WBC 14.9 (H) 4.0 - 10.5 K/uL   RBC 4.90 4.22 - 5.81 MIL/uL   Hemoglobin 16.4 13.0 - 17.0 g/dL   HCT 91.4 78.2 - 95.6 %   MCV 93.9 80.0 - 100.0 fL   MCH  33.5 26.0 - 34.0 pg   MCHC 35.7 30.0 - 36.0 g/dL   RDW 21.3 08.6 - 57.8 %   Platelets 388 150 - 400 K/uL   nRBC 0.0 0.0 - 0.2 %    Comment: Performed at [facility], [address], [place], [place] [address]  Magnesium     Status: None   Collection Time: 08/02/23 12:38 AM  Result Value Ref Range   Magnesium 2.1 1.7 - 2.4 mg/dL    Comment: Performed at [facility], [address], [place], [place] [address]  Troponin I (High Sensitivity)     Status: None   Collection Time: 08/02/23 12:38 AM  Result Value Ref Range   Troponin I (High Sensitivity) 16 <18 ng/L    Comment: (NOTE) Elevated high sensitivity troponin I (hsTnI) values and significant  changes across serial measurements may suggest ACS but many other  chronic and acute conditions are known to elevate hsTnI results.  Refer to the "Links" section for chest pain algorithms and additional  guidance. Performed at [facility], [address] [place], [place] [address]   CK     Status: Abnormal   Collection Time: 08/02/23 12:38 AM  Result Value Ref Range   Total CK 4,639 (H) 49 - 397 U/L    Comment: RESULT CONFIRMED BY MANUAL DILUTION Performed at [facility], [address], [place], [place] [address]   Rapid urine drug screen (hospital performed)     Status: Abnormal   Collection Time: 08/02/23  2:01 AM  Result Value Ref Range   Opiates NONE DETECTED NONE DETECTED   Cocaine NONE DETECTED NONE DETECTED   Benzodiazepines NONE DETECTED NONE DETECTED   Amphetamines NONE DETECTED NONE DETECTED   Tetrahydrocannabinol POSITIVE (A) NONE DETECTED   Barbiturates NONE DETECTED NONE DETECTED    Comment: (NOTE) DRUG SCREEN FOR  MEDICAL PURPOSES ONLY.  IF CONFIRMATION IS NEEDED FOR ANY PURPOSE, NOTIFY LAB WITHIN 5 DAYS.  LOWEST DETECTABLE LIMITS FOR URINE DRUG SCREEN Drug Class                     Cutoff (ng/mL) Amphetamine and metabolites     1000 Barbiturate and metabolites    200 Benzodiazepine                 200 Opiates and metabolites        300 Cocaine and metabolites        300 THC                            50 Performed at [facility] [address], [place], [place] [address]   Troponin I (High Sensitivity)     Status: Abnormal   Collection Time: 08/02/23  4:00 AM  Result Value Ref Range   Troponin I (High Sensitivity) 21 (H) <18 ng/L    Comment: (NOTE) Elevated high sensitivity troponin I (hsTnI) values and significant  changes across serial measurements may suggest ACS but many other  chronic and acute conditions are known to elevate hsTnI results.  Refer to the "Links" section for chest pain algorithms and additional  guidance. Performed at [facility], [address], [place], [place] [address]   Comprehensive metabolic panel     Status: Abnormal   Collection Time: 08/02/23  4:00 AM  Result Value Ref Range   Sodium 132 (L) 135 - 145 mmol/L   Potassium 3.7 3.5 - 5.1 mmol/L   Chloride 93 (L) 98 - 111 mmol/L   CO2 21 (L) 22 - 32 mmol/L   Glucose, Bld 105 (H) 70 - 99 mg/dL    Comment: Glucose reference range applies only to samples taken after fasting for at least 8 hours.   BUN 11 6 - 20 mg/dL   Creatinine, Ser 8.46 0.61 - 1.24 mg/dL   Calcium 8.6 (L) 8.9 - 10.3 mg/dL   Total Protein 6.3 (L) 6.5 - 8.1 g/dL   Albumin 3.7 3.5 - 5.0 g/dL   AST 962 (H) 15 - 41 U/L   ALT 161 (H) 0 - 44 U/L   Alkaline Phosphatase 94 38 - 126 U/L   Total Bilirubin 0.5 0.3 - 1.2 mg/dL   GFR, Estimated >95 >28 mL/min    Comment: (NOTE) Calculated using the CKD-EPI Creatinine Equation (2021)    Anion gap 18 (H) 5 - 15    Comment: Performed at [facility], [address], [place], [place] [address]    Current Facility-Administered Medications  Medication Dose Route Frequency Provider Last Rate Last Admin   dextrose 5 %-0.9 % sodium chloride infusion   Intravenous  Continuous Hanley Ben, Kshitiz, MD   Stopped at 08/02/23 1205   enoxaparin (LOVENOX) injection 40 mg  40 mg Subcutaneous Q24H Alekh, Kshitiz, MD   40 mg at 08/02/23 1022   folic acid (FOLVITE) tablet 1 mg  1 mg Oral Daily Alekh, Kshitiz, MD   1 mg at 08/02/23 1022   ibuprofen (ADVIL) tablet 400 mg  400 mg Oral Q6H PRN Glade Lloyd, MD       levETIRAcetam (KEPPRA) IVPB 500 mg/100 mL premix  500 mg Intravenous Q12H Glade Lloyd, MD   Stopped at 08/02/23 0731   LORazepam (ATIVAN) injection 0-4 mg  0-4 mg Intravenous Q6H Glade Lloyd, MD  1 mg at 08/02/23 0730   Followed by   Melene Muller ON 08/04/2023] LORazepam (ATIVAN) injection 0-4 mg  0-4 mg Intravenous Q12H Alekh, Kshitiz, MD       LORazepam (ATIVAN) tablet 1-4 mg  1-4 mg Oral Q1H PRN Glade Lloyd, MD       Or   LORazepam (ATIVAN) injection 1-4 mg  1-4 mg Intravenous Q1H PRN Glade Lloyd, MD       multivitamin with minerals tablet 1 tablet  1 tablet Oral Daily Glade Lloyd, MD   1 tablet at 08/02/23 1022   ondansetron (ZOFRAN) tablet 4 mg  4 mg Oral Q6H PRN Glade Lloyd, MD       Or   ondansetron (ZOFRAN) injection 4 mg  4 mg Intravenous Q6H PRN Hanley Ben, Kshitiz, MD       thiamine (VITAMIN B1) tablet 100 mg  100 mg Oral Daily Hanley Ben, Kshitiz, MD   100 mg at 08/02/23 1022   Or   thiamine (VITAMIN B1) injection 100 mg  100 mg Intravenous Daily Glade Lloyd, MD       Current Outpatient Medications  Medication Sig Dispense Refill   chlordiazePOXIDE (LIBRIUM) 25 MG capsule 25mg  PO TID x 1D, then 25mg  PO BID X 1D, then 25mg  PO QD X 1D (Patient not taking: Reported on 08/02/2023) 10 capsule 0   dicyclomine (BENTYL) 20 MG tablet Take 1 tablet (20 mg total) by mouth 3 (three) times daily as needed for spasms. (Patient not taking: Reported on 08/02/2023) 20 tablet 0   potassium chloride (KLOR-CON) 10 MEQ tablet Take 2 tablets (20 mEq total) by mouth daily for 7 days. (Patient not taking: Reported on 08/02/2023) 14 tablet 0     Musculoskeletal: Strength & Muscle Tone: within normal limits Gait & Station: normal Patient leans: N/A   Psychiatric Specialty Exam: Presentation  General Appearance:  Disheveled  Eye Contact: Good  Speech: Clear and Coherent  Speech Volume: Normal  Handedness: Right   Mood and Affect  Mood: Hopeless  Affect: Restricted   Thought Process  Thought Processes: Coherent  Descriptions of Associations:Intact  Orientation:Full (Time, Place and Person)  Thought Content:Logical  History of Schizophrenia/Schizoaffective disorder:No  Duration of Psychotic Symptoms:No data recorded Hallucinations:Hallucinations: None  Ideas of Reference:None  Suicidal Thoughts:Suicidal Thoughts: No  Homicidal Thoughts:Homicidal Thoughts: No   Sensorium  Memory: Immediate Good; Recent Good  Judgment: Impaired  Insight: Poor   Executive Functions  Concentration: Fair  Attention Span: Fair  Recall: [employer] of Knowledge: Fair  Language: Fair   Psychomotor Activity  Psychomotor Activity: Psychomotor Activity: Normal   Assets  Assets: Communication Skills; Social Support; Desire for Improvement; Housing    Sleep  Sleep: Sleep: Fair   Physical Exam: Physical Exam Vitals and nursing note reviewed. Exam conducted with a chaperone present.  Neurological:     Mental Status: He is alert.  Psychiatric:        Attention and Perception: Attention normal.        Mood and Affect: Mood is depressed.        Speech: Speech normal.        Behavior: Behavior is cooperative.        Thought Content: Thought content includes suicidal ideation.        Cognition and Memory: Memory normal.        Judgment: Judgment is inappropriate.    Review of Systems  Constitutional: Negative.   Psychiatric/Behavioral:  Positive for depression, substance abuse and suicidal ideas.  Blood pressure 129/85, pulse 88, temperature (!) 97.5 F (36.4 C), temperature  source Oral, resp. rate (!) 24, SpO2 94%. There is no height or weight on file to calculate BMI.   Medical Decision Making: Pt case reviewed and discussed with Dr. Lucianne Muss. Pt does meet criteria for IVC and inpatient psychiatric treatment. Patient needs inpatient psychiatric admission for stabilization and treatment. [facility] and CSW notified of disposition. EDP, RN, and LCSW notified of disposition.     Disposition: Recommend psychiatric Inpatient admission.  Alona Bene, PMHNP 08/02/2023 4:09 PM

## 2023-08-02 NOTE — Progress Notes (Signed)
Pt was accepted to [facility] TODAY 08/02/2023; Bed Assignment 304-1  Pt meets inpatient criteria per Alona Bene, PMHNP  Attending Physician will be Dr. Phineas Inches, MD   Report can be called to: - Adult unit: [phone]  Pt can arrive after: 2:00pm  Care Team notified: Day [facility], Lauren Cocoa, Paramedic,Gabriela Caren Macadam Cunningham,NT, [address] [place], PMHNP    [place], [place] 08/02/2023 @ 12:13 PM

## 2023-08-02 NOTE — ED Notes (Signed)
Attempt to change out PT into scrubs PT became upset stated " why do everyone think I am trying to hurt myself" xray tech came to get PT for scan. Xray tech followed PT seen going out of EMS bay. Rancour, MD have been made aware. PT is now IVC.

## 2023-08-02 NOTE — ED Notes (Signed)
[facility] Celine Ahr, Zella Richer, called me back for collateral.  Ailene Ravel expressed her concerns about Fady not having medical insurance while dealing with his current medical and mental health state.  Fannie Knee is happy to be able to provide family support for Yitzchak and had a phone conversation with him at 4am this morning.  Fannie Knee advised that Demitrus had a SI yesterday and purposely kept this quiet from the ED staff to avoid being admitted for treatment.  Shawntel lost his mother (57yrs old) in 2017.  She added that Mikale's mother was previously diagnosed with Major Depressive Disorder (MDD) and Generalized Anxiety Disorder (GAD).  In addition, Robi's father was placed in a nursing facility since 2015 and will be residing there permanently due to functional decline.  Fannie Knee believes Jayson's father presented mental health challenges for many years although he was in denial of his diagnoses.    Fannie Knee stated that Tommaso's seizures have been ongoing for the past 6 to 8 months and affects his driving,causes memory loss, and cognitive distortions.  According to Ailene Ravel, Oz has no alcohol issues and only has an occasional beer but does not drink liquor.  She added that Cort lives with his uncle and grandmother who has dementia.  Jamieon's uncle is at least 35 years old with an onset of dementia and has a history of alcoholism.     Graylon Good, [facility]

## 2023-08-02 NOTE — ED Notes (Signed)
ED Provider at bedside. 

## 2023-08-02 NOTE — Plan of Care (Signed)
Pt still not at [facility] - no beds per flow manager. Will be here at a later point TBD. Will see in consult when arrives.  -- Milon Dikes, MD Neurologist

## 2023-08-02 NOTE — ED Provider Notes (Signed)
[facility] AT [facility] Provider Note   CSN: [id number] Arrival date & time: 08/01/23  2332     History  Chief Complaint  Patient presents with   Seizures    Stephen [name] is a 32 y.o. male.  Patient brought from behavioral health urgent care with concern for seizures.  He states he had 4 seizure-like episodes today that he describes as "spacing out, muscle stiffening, tensing up and shaking all over".  He had about 4 these episodes today lasting for 10 to 15 minutes at a time.  He states he does have a seizure disorder and takes gabapentin but has never seen a neurologist.  He does not know who prescribed him gabapentin.  States he been having seizures on and off for 2 years but never seen a neurologist.  He describes he is aware of these episodes sometimes but other times not.  Denies any tongue biting or incontinence.  Denies hitting his head.  He cannot complains of a headache and feeling achy and tired all over.  No fever.  No chest pain or shortness of breath.  No vomiting.  Does admit to daily marijuana use.  States his last drink of alcohol was 2 weeks ago.  He denies any history of alcohol withdrawal or delirium tremens.  He was seen in urgent care earlier with concern for suicidal ideation and cutting himself with a box cutter.  He now denies any suicidal thoughts or homicidal thoughts.  Denies hearing any voices.  Complains of a headache and feeling sore all over.  The history is provided by the patient and the EMS personnel.  Seizures      Home Medications Prior to Admission medications   Medication Sig Start Date End Date Taking? Authorizing Provider  chlordiazePOXIDE (LIBRIUM) 25 MG capsule 25mg  PO TID x 1D, then 25mg  PO BID X 1D, then 25mg  PO QD X 1D 10/28/22   Mardene Sayer, MD  dicyclomine (BENTYL) 20 MG tablet Take 1 tablet (20 mg total) by mouth 3 (three) times daily as needed for spasms. 09/16/18   Donnetta Hutching, MD  potassium  chloride (KLOR-CON) 10 MEQ tablet Take 2 tablets (20 mEq total) by mouth daily for 7 days. 10/28/22 11/04/22  Mardene Sayer, MD      Allergies    Patient has no known allergies.    Review of Systems   Review of Systems  Constitutional:  Negative for activity change, appetite change and fever.  HENT:  Negative for congestion and postnasal drip.   Respiratory:  Negative for cough, chest tightness and shortness of breath.   Gastrointestinal:  Negative for abdominal pain, nausea and vomiting.  Genitourinary:  Negative for dysuria and hematuria.  Musculoskeletal:  Positive for arthralgias and myalgias.  Skin:  Negative for rash.  Neurological:  Positive for dizziness, seizures and headaches. Negative for weakness.   all other systems are negative except as noted in the HPI and PMH.    Physical Exam Updated Vital Signs BP (!) 137/115   Pulse (!) 114   Temp 98.4 F (36.9 C) (Oral)   Resp (!) 23   SpO2 95%  Physical Exam Vitals and nursing note reviewed.  Constitutional:      General: He is not in acute distress.    Appearance: He is well-developed.     Comments: Appears intoxicated  HENT:     Head: Normocephalic and atraumatic.     Mouth/Throat:     Pharynx: No  oropharyngeal exudate.     Comments: No tongue trauma Eyes:     Conjunctiva/sclera: Conjunctivae normal.     Pupils: Pupils are equal, round, and reactive to light.  Neck:     Comments: No meningismus. Cardiovascular:     Rate and Rhythm: Regular rhythm. Tachycardia present.     Heart sounds: Normal heart sounds. No murmur heard. Pulmonary:     Effort: Pulmonary effort is normal. No respiratory distress.     Breath sounds: Normal breath sounds.  Abdominal:     Palpations: Abdomen is soft.     Tenderness: There is no abdominal tenderness. There is no guarding or rebound.  Musculoskeletal:        General: No tenderness. Normal range of motion.     Cervical back: Normal range of motion and neck supple.  Skin:     General: Skin is warm.  Neurological:     Mental Status: He is alert and oriented to person, place, and time.     Cranial Nerves: No cranial nerve deficit.     Motor: No abnormal muscle tone.     Coordination: Coordination normal.     Comments:  5/5 strength throughout. CN 2-12 intact.Equal grip strength.   Psychiatric:        Behavior: Behavior normal.     ED Results / Procedures / Treatments   Labs (all labs ordered are listed, but only abnormal results are displayed) Labs Reviewed  COMPREHENSIVE METABOLIC PANEL - Abnormal; Notable for the following components:      Result Value   Sodium 133 (*)    Chloride 93 (*)    CO2 18 (*)    AST 356 (*)    ALT 186 (*)    Anion gap 22 (*)    All other components within normal limits  ETHANOL - Abnormal; Notable for the following components:   Alcohol, Ethyl (B) 248 (*)    All other components within normal limits  SALICYLATE LEVEL - Abnormal; Notable for the following components:   Salicylate Lvl <7.0 (*)    All other components within normal limits  ACETAMINOPHEN LEVEL - Abnormal; Notable for the following components:   Acetaminophen (Tylenol), Serum <10 (*)    All other components within normal limits  CBC - Abnormal; Notable for the following components:   WBC 14.9 (*)    All other components within normal limits  CK - Abnormal; Notable for the following components:   Total CK 4,639 (*)    All other components within normal limits  MAGNESIUM  RAPID URINE DRUG SCREEN, HOSP PERFORMED  COMPREHENSIVE METABOLIC PANEL  TROPONIN I (HIGH SENSITIVITY)  TROPONIN I (HIGH SENSITIVITY)    EKG EKG Interpretation Date/Time:  Thursday August 01 2023 23:48:09 EDT Ventricular Rate:  124 PR Interval:  122 QRS Duration:  93 QT Interval:  311 QTC Calculation: 447 R Axis:   89  Text Interpretation: Sinus tachycardia Ventricular premature complex Aberrant complex RSR' in V1 or V2, probably normal variant ST elev, probable normal early  repol pattern No significant change was found Confirmed by Glynn Octave [phone]) on 08/02/2023 1:08:08 AM  Radiology CT Head Wo Contrast  Result Date: 08/02/2023 CLINICAL DATA:  Seizure.  Headache EXAM: CT HEAD WITHOUT CONTRAST TECHNIQUE: Contiguous axial images were obtained from the base of the skull through the vertex without intravenous contrast. RADIATION DOSE REDUCTION: This exam was performed according to the departmental dose-optimization program which includes automated exposure control, adjustment of the mA and/or kV according to  patient size and/or use of iterative reconstruction technique. COMPARISON:  10/28/2022 FINDINGS: Brain: No acute intracranial abnormality. Specifically, no hemorrhage, hydrocephalus, mass lesion, acute infarction, or significant intracranial injury. Vascular: No hyperdense vessel or unexpected calcification. Skull: No acute calvarial abnormality. Sinuses/Orbits: No acute findings Other: None IMPRESSION: Normal study. Electronically Signed   By: Charlett Nose M.D.   On: 08/02/2023 02:47    Procedures .Critical Care  Performed by: Glynn Octave, MD Authorized by: Glynn Octave, MD   Critical care provider statement:    Critical care time (minutes):  45   Critical care time was exclusive of:  Separately billable procedures and treating other patients   Critical care was necessary to treat or prevent imminent or life-threatening deterioration of the following conditions:  CNS failure or compromise   Critical care was time spent personally by me on the following activities:  Development of treatment plan with patient or surrogate, discussions with consultants, evaluation of patient's response to treatment, examination of patient, ordering and review of laboratory studies, ordering and review of radiographic studies, ordering and performing treatments and interventions, pulse oximetry, re-evaluation of patient's condition, review of old charts, blood draw for  specimens and obtaining history from patient or surrogate   I assumed direction of critical care for this patient from another provider in my specialty: no     Care discussed with: admitting provider       Medications Ordered in ED Medications  LORazepam (ATIVAN) tablet 1-4 mg (has no administration in time range)    Or  LORazepam (ATIVAN) injection 1-4 mg (has no administration in time range)  thiamine (VITAMIN B1) tablet 100 mg (has no administration in time range)    Or  thiamine (VITAMIN B1) injection 100 mg (has no administration in time range)  folic acid (FOLVITE) tablet 1 mg (has no administration in time range)  multivitamin with minerals tablet 1 tablet (has no administration in time range)  LORazepam (ATIVAN) injection 0-4 mg (has no administration in time range)    Followed by  LORazepam (ATIVAN) injection 0-4 mg (has no administration in time range)  lactated ringers bolus 1,000 mL (has no administration in time range)    ED Course/ Medical Decision Making/ A&P                                 Medical Decision Making Amount and/or Complexity of Data Reviewed Labs: ordered. Decision-making details documented in ED Course. Radiology: ordered and independent interpretation performed. Decision-making details documented in ED Course. ECG/medicine tests: ordered and independent interpretation performed. Decision-making details documented in ED Course.  Risk OTC drugs. Prescription drug management. Decision regarding hospitalization.   Patient from behavioral health urgent care with concern for seizure-like episodes.  Tachycardic and hypertensive on arrival.  Neurological exam is nonfocal.  States last alcoholic drink was 2 weeks ago.  Does have history of seizure-like episodes in the past but never seen a neurologist.  Concern for passive suicidal ideation.  Patient seen at behavioral health urgent care earlier and recommendations made for inpatient  admission.  Concern for alcohol withdrawal causing patient's symptoms.  He denies any suicidal ideation currently.  However given his earlier statements, IVC paperwork is being completed.  Patient apparently eloped from the ED well paperwork was in process.  Security and [employer] made aware  Patient brought back to the ED by security and police.  IVC paperwork is completed.  Initial CIWA score  is 7.  Remains tachycardic and hypertensive. Lab show anion gap acidosis of 22 and transaminitis likely in the secondary to alcohol intoxication. Ethanol Level was 248  Patient found to have transaminitis, anion gap acidosis, tachycardia and hypertension concerning for alcohol withdrawal.  His seizure episodes may be due to DTs or alcohol withdrawal despite his alcohol level of 248. Labs also remarkable for leukocytosis and elevated CK of 4600.  D/w Dr, Derry Lory neurology.  He agrees with 4-5 seizure-like episodes today would recommend medical admission.  Recommends loading with Keppra and transferring to [facility] for continuous EEG monitoring. Will also give CIWA protocol.  Though unlikely to have alcohol withdrawal with alcohol level 248.  Patient now calm and cooperative.  He is agreeable to admission.  Will require psychiatry evaluation after his medical workup.  Discussed with Dr. Joneen Roach.  The patient remains tachycardic but calm and cooperative.  He is given IV fluids, IV Ativan, folate and thiamine.  CT head is negative for acute pathology.  Continue CIWA protocol, loaded with Keppra, IV fluids to treat his rhabdomyolysis.  Medical admission to [facility] for EEG monitoring as well as MRI.  Will need psychiatry evaluation after he is medically clear. IVC in place.       Final Clinical Impression(s) / ED Diagnoses Final diagnoses:  Seizure-like activity (HCC)  Alcoholic intoxication without complication (HCC)  Non-traumatic rhabdomyolysis    Rx / DC Orders ED Discharge Orders      None         Bayani Renteria, Jeannett Senior, MD 08/02/23 0321

## 2023-08-02 NOTE — ED Notes (Signed)
Pt. In burgundy scrubs and wanded by security. Pt. Has 1 belongings bag. Pt.has 1 black cell phone, 1 black pr. Shoes, 1 pr. Black socks, 1 black jacket, 1 blue t-shirt, 1 beige pant, 1 charger, 1 car keys and 1 cigarette lighter. Pt. Belongings locked up behind the nurses station in triage.

## 2023-08-02 NOTE — Progress Notes (Signed)
Pt will be accepted to [facility] per [facility] AC Brook McNichol,RN.   Maryjean Ka, MSW, [facility] 08/02/2023 12:49 PM

## 2023-08-02 NOTE — Consult Note (Signed)
NEUROLOGY CONSULT NOTE   Date of service: August 02, 2023 Patient Name: Stephen [name] MRN:  [id number] DOB:  1991-07-23 Chief Complaint: "Seizures" Requesting Provider: Glade Lloyd, MD  History of Present Illness  Stephen [name] is a 32 year old man who has a significant past medical history of depression and self-described history of seizure disorder.  He describes that his seizures started in situations of stress about 2 years ago and he has had off-and-on seizures.  They happen sporadically.  He is most of the times aware of when his seizures are happening-he feels stiffness around his whole body and has tremulousness.  No tongue bite or urinary incontinence. He was brought in to the hospital this time-initially to the behavioral health urgent care with concern for seizure activity because he had forced seizure-like spells of spacing out followed by muscle stiffening followed by tensing and shaking.  They lasted about 10 to 15 minutes.  He has been taking gabapentin for it which he does not remember who has prescribed-review of chart reveals that he has taken it from a family member.  He denies family history of seizure disorder. Denies history of head trauma Denies regular alcohol use but says that he was yesterday on a welfare check by the sheriff and brought to the hospital and was told that he would be handcuffed for the ride and that is when he took a large shot of liquor. Smokes tobacco and marijuana regularly. At the behavioral health urgent care, there was concern for suicidal ideation and cutting himself with box cutter but he denies any suicidal or homicidal thoughts now and also denied it at the [facility] emergency department. He was transferred to [facility] for neurology evaluation as well as possible electrographic studies    ROS  ROS performed-positive for anxiety and depression.  Otherwise negative  Past History   Past Medical History:  Diagnosis Date    Depression    Seizures (HCC)     Past Surgical History:  Procedure Laterality Date   WISDOM TOOTH EXTRACTION      Family History: History reviewed. No pertinent family history.  Social History  reports that he has been smoking cigars. He has never used smokeless tobacco. He reports current alcohol use of about 3.0 standard drinks of alcohol per week. He reports current drug use. Drug: Marijuana.  No Known Allergies  Medications   Current Facility-Administered Medications:    dextrose 5 %-0.9 % sodium chloride infusion, , Intravenous, Continuous, Alekh, Kshitiz, MD, Stopped at 08/02/23 1205   enoxaparin (LOVENOX) injection 40 mg, 40 mg, Subcutaneous, Q24H, Alekh, Kshitiz, MD, 40 mg at 08/02/23 1022   folic acid (FOLVITE) tablet 1 mg, 1 mg, Oral, Daily, Alekh, Kshitiz, MD, 1 mg at 08/02/23 1022   ibuprofen (ADVIL) tablet 400 mg, 400 mg, Oral, Q6H PRN, Hanley Ben, Kshitiz, MD   levETIRAcetam (KEPPRA) IVPB 500 mg/100 mL premix, 500 mg, Intravenous, Q12H, Alekh, Kshitiz, MD, Stopped at 08/02/23 0731   LORazepam (ATIVAN) injection 0-4 mg, 0-4 mg, Intravenous, Q6H, 1 mg at 08/02/23 0730 **FOLLOWED BY** [START ON 08/04/2023] LORazepam (ATIVAN) injection 0-4 mg, 0-4 mg, Intravenous, Q12H, Alekh, Kshitiz, MD   LORazepam (ATIVAN) tablet 1-4 mg, 1-4 mg, Oral, Q1H PRN **OR** LORazepam (ATIVAN) injection 1-4 mg, 1-4 mg, Intravenous, Q1H PRN, Hanley Ben, Kshitiz, MD   multivitamin with minerals tablet 1 tablet, 1 tablet, Oral, Daily, Alekh, Kshitiz, MD, 1 tablet at 08/02/23 1022   ondansetron (ZOFRAN) tablet 4 mg, 4 mg, Oral, Q6H PRN **OR**  ondansetron (ZOFRAN) injection 4 mg, 4 mg, Intravenous, Q6H PRN, Hanley Ben, Kshitiz, MD   thiamine (VITAMIN B1) tablet 100 mg, 100 mg, Oral, Daily, 100 mg at 08/02/23 1022 **OR** thiamine (VITAMIN B1) injection 100 mg, 100 mg, Intravenous, Daily, Alekh, Kshitiz, MD  Vitals   Vitals:   08/02/23 1200 08/02/23 1500 08/02/23 1628 08/02/23 1628  BP: 127/86 129/85 (!) 138/92 (!)  138/92  Pulse: (!) 102 88  96  Resp: (!) 34 (!) 24 20 19   Temp:   98.7 F (37.1 C)   TempSrc:   Oral   SpO2: 98% 94%  98%    There is no height or weight on file to calculate BMI.  Physical Exam  General: Well-developed well-nourished poorly groomed man in no apparent distress HEENT: Normocephalic atraumatic Lungs: Breathing well saturating normally on room air CVs: Regular rate rhythm Neurological exam Awake alert oriented x 3 No dysarthria No aphasia Somewhat of a flat affect. Cranial nerves II to XII intact Motor examination with no drift in any of the 4 extremities Sensation intact light touch Coordination with no dysmetria NIH stroke scale-0  Labs   CBC:  Recent Labs  Lab 08/02/23 0038  WBC 14.9*  HGB 16.4  HCT 46.0  MCV 93.9  PLT 388    Basic Metabolic Panel:  Lab Results  Component Value Date   NA 132 (L) 08/02/2023   K 3.7 08/02/2023   CO2 21 (L) 08/02/2023   GLUCOSE 105 (H) 08/02/2023   BUN 11 08/02/2023   CREATININE 0.78 08/02/2023   CALCIUM 8.6 (L) 08/02/2023   GFRNONAA >60 08/02/2023   GFRAA >60 09/16/2018   Urine Drug Screen:     Component Value Date/Time   LABOPIA NONE DETECTED 08/02/2023 0201   COCAINSCRNUR NONE DETECTED 08/02/2023 0201   LABBENZ NONE DETECTED 08/02/2023 0201   AMPHETMU NONE DETECTED 08/02/2023 0201   THCU POSITIVE (A) 08/02/2023 0201   LABBARB NONE DETECTED 08/02/2023 0201    Alcohol Level     Component Value Date/Time   ETH 248 (H) 08/02/2023 0038     CT head without contrast: No acute abnormality  Assessment   Stephen [name] is a 32 y.o. 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 The history provided by him in the emergency department and here is not  very congruent but at this point, I think it is reasonable that he be evaluated further by an LTM EEG to characterize his spells so that of a definitive diagnosis could be made and he could be helped with the appropriate medication and management.  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 Hanley Ben Neurology will continue to follow with you  Signed,  Milon Dikes, MD Neurology

## 2023-08-02 NOTE — ED Notes (Signed)
Pt returned with [employer] under IVC.

## 2023-08-02 NOTE — H&P (Signed)
History and Physical    Stephen [name] Stephen [name]:[id number] DOB: 12-11-1990 DOA: 08/01/2023  PCP: Patient, No Pcp Per   Patient coming from: Home  I have personally briefly reviewed patient's old medical records in [facility]  Chief Complaint: Seizures  HPI: Stephen [name] is a 32 y.o. male with medical history significant of depression, seizures was brought from behavioral health urgent care with concern for seizures.  Patient is a poor historian and reliability of history is poor.  I have reviewed patient's medical records including ED providers documentation.  Apparently, he had 4 seizure-like episodes yesterday that he described like "spacing out, muscle stiffening, tensing up and shaking all over".  These episodes lasted for 10 to 15 minutes at a time apparently.  He has been on gabapentin for seizure disorder but has not seen a neurologist.  He apparently has been having seizures on and off for last 2 years.  He is sometimes aware of these episodes and sometimes not.  Denied any tongue bite or incontinence or head trauma.  No fever, vomiting, chest pain, shortness of breath, abdominal pain, diarrhea or dysuria. He was seen in behavioral health urgent care with concern for suicidal ideation and cutting himself with a box cutter and recommendations was made for inpatient psychiatric admission.  ED Course: Patient was IVC'd.  Ethanol level was 248.  CK of 4639.  CT head without contrast was negative for any acute intracranial abnormality.  ED provider discussed with on-call neurologist who recommended loading with Keppra and transferring to [facility] for continuous EEG.  He was also started on CIWA protocol.  Hospitalist service was called to evaluate the patient.  Review of Systems: As per HPI otherwise all other systems were reviewed and are negative.   Past Medical History:  Diagnosis Date   Depression    Seizures (HCC)     Past Surgical History:  Procedure Laterality Date    WISDOM TOOTH EXTRACTION       reports that he has been smoking cigars. He has never used smokeless tobacco. He reports current alcohol use of about 3.0 standard drinks of alcohol per week. He reports current drug use. Drug: Marijuana.  No Known Allergies  History reviewed. No pertinent family history.  Prior to Admission medications   Medication Sig Start Date End Date Taking? Authorizing Provider  chlordiazePOXIDE (LIBRIUM) 25 MG capsule 25mg  PO TID x 1D, then 25mg  PO BID X 1D, then 25mg  PO QD X 1D Patient not taking: Reported on 08/02/2023 10/28/22   Mardene Sayer, MD  dicyclomine (BENTYL) 20 MG tablet Take 1 tablet (20 mg total) by mouth 3 (three) times daily as needed for spasms. Patient not taking: Reported on 08/02/2023 09/16/18   Donnetta Hutching, MD  potassium chloride (KLOR-CON) 10 MEQ tablet Take 2 tablets (20 mEq total) by mouth daily for 7 days. Patient not taking: Reported on 08/02/2023 10/28/22 11/04/22  Mardene Sayer, MD    Physical Exam: Vitals:   08/02/23 0327 08/02/23 0410 08/02/23 0410 08/02/23 0545  BP: 123/74   126/75  Pulse: (!) 111  (!) 121 (!) 107  Resp: 17  16 16   Temp: (!) 97.5 F (36.4 C)     TempSrc: Oral     SpO2: 100% 99% 99% 99%    Constitutional: NAD, calm, comfortable.  On room air. Vitals:   08/02/23 0327 08/02/23 0410 08/02/23 0410 08/02/23 0545  BP: 123/74   126/75  Pulse: (!) 111  (!) 121 (!) 107  Resp: 17  16 16   Temp: (!) 97.5 F (36.4 C)     TempSrc: Oral     SpO2: 100% 99% 99% 99%   Eyes: PERRL, lids and conjunctivae normal ENMT: Mucous membranes are moist. Posterior pharynx clear of any exudate or lesions. Neck: normal, supple, no masses, no thyromegaly Respiratory: bilateral decreased breath sounds at bases, no wheezing, no crackles. Normal respiratory effort. No accessory muscle use.  Cardiovascular: S1 S2 positive, tachycardic. No extremity edema. 2+ pedal pulses.  Abdomen: no tenderness, no masses palpated. No  hepatosplenomegaly. Bowel sounds positive.  Musculoskeletal: no clubbing / cyanosis. No joint deformity upper and lower extremities.  Skin: no rashes, lesions, ulcers. No induration Neurologic: CN 2-12 grossly intact. Moving extremities. No focal neurologic deficits.  No seizures noted. Psychiatric: Not agitated currently.  Flat affect.  Does not participate in conversation much.  Labs on Admission: I have personally reviewed following labs and imaging studies  CBC: Recent Labs  Lab 08/02/23 0038  WBC 14.9*  HGB 16.4  HCT 46.0  MCV 93.9  PLT 388   Basic Metabolic Panel: Recent Labs  Lab 08/02/23 0038 08/02/23 0400  NA 133* 132*  K 3.8 3.7  CL 93* 93*  CO2 18* 21*  GLUCOSE 74 105*  BUN 11 11  CREATININE 0.71 0.78  CALCIUM 9.4 8.6*  MG 2.1  --    GFR: CrCl cannot be calculated (Unknown ideal weight.). Liver Function Tests: Recent Labs  Lab 08/02/23 0038 08/02/23 0400  AST 356* 312*  ALT 186* 161*  ALKPHOS 112 94  BILITOT 0.9 0.5  PROT 7.9 6.3*  ALBUMIN 4.6 3.7   No results for input(s): "LIPASE", "AMYLASE" in the last 168 hours. No results for input(s): "AMMONIA" in the last 168 hours. Coagulation Profile: No results for input(s): "INR", "PROTIME" in the last 168 hours. Cardiac Enzymes: Recent Labs  Lab 08/02/23 0038  CKTOTAL 4,639*   BNP (last 3 results) No results for input(s): "PROBNP" in the last 8760 hours. HbA1C: No results for input(s): "HGBA1C" in the last 72 hours. CBG: No results for input(s): "GLUCAP" in the last 168 hours. Lipid Profile: No results for input(s): "CHOL", "HDL", "LDLCALC", "TRIG", "CHOLHDL", "LDLDIRECT" in the last 72 hours. Thyroid Function Tests: No results for input(s): "TSH", "T4TOTAL", "FREET4", "T3FREE", "THYROIDAB" in the last 72 hours. Anemia Panel: No results for input(s): "VITAMINB12", "FOLATE", "FERRITIN", "TIBC", "IRON", "RETICCTPCT" in the last 72 hours. Urine analysis:    Component Value Date/Time    COLORURINE AMBER (A) 10/28/2022 1642   APPEARANCEUR HAZY (A) 10/28/2022 1642   LABSPEC 1.027 10/28/2022 1642   PHURINE 7.0 10/28/2022 1642   GLUCOSEU NEGATIVE 10/28/2022 1642   HGBUR NEGATIVE 10/28/2022 1642   BILIRUBINUR MODERATE (A) 10/28/2022 1642   KETONESUR 80 (A) 10/28/2022 1642   PROTEINUR 100 (A) 10/28/2022 1642   UROBILINOGEN 0.2 11/13/2011 0402   NITRITE NEGATIVE 10/28/2022 1642   LEUKOCYTESUR NEGATIVE 10/28/2022 1642    Radiological Exams on Admission: CT Head Wo Contrast  Result Date: 08/02/2023 CLINICAL DATA:  Seizure.  Headache EXAM: CT HEAD WITHOUT CONTRAST TECHNIQUE: Contiguous axial images were obtained from the base of the skull through the vertex without intravenous contrast. RADIATION DOSE REDUCTION: This exam was performed according to the departmental dose-optimization program which includes automated exposure control, adjustment of the mA and/or kV according to patient size and/or use of iterative reconstruction technique. COMPARISON:  10/28/2022 FINDINGS: Brain: No acute intracranial abnormality. Specifically, no hemorrhage, hydrocephalus, mass lesion, acute infarction, or significant intracranial  injury. Vascular: No hyperdense vessel or unexpected calcification. Skull: No acute calvarial abnormality. Sinuses/Orbits: No acute findings Other: None IMPRESSION: Normal study. Electronically Signed   By: Charlett Nose M.D.   On: 08/02/2023 02:47     Assessment/Plan  Seizures -Patient apparently has history of seizures and has been on gabapentin for the same but has never been evaluated by neurologist in the past.  Apparently had 4 episodes of seizures prior to presentation, is lasting for 10 to 15 minutes.  Ethanol level was 248 on presentation.  Unsure if this was alcohol withdrawal seizure as well.  CT of the head was negative for intracranial abnormality. -Neurology recommending transfer to [facility] for continuous EEG.  Continue IV Keppra for now. -Fall precautions.   Seizure precautions  Alcohol abuse Concern for alcohol withdrawal -Continue CIWA protocol.  TOC consult  Suicidal ideation Depression -Presented to behavioral health urgent care with suicidal ideation and recommendations was for inpatient psychiatric hospitalization.  Currently IVC.  Consult psychiatry.  Continue sitter at bedside.  Elevated LFTs -Possibly from alcohol abuse.  Monitor  Hyponatremia Mild.  Monitor.  Acute metabolic acidosis -Improving.  Monitor  Leukocytosis -Possibly reactive.  Repeat a.m. labs.  Elevated CK -CK total 4639 on presentation.  Continue IV fluids.  Repeat a.m. CK.  Marijuana use -Urine drug screen positive for tetrahydrocannabinol.  TOC consult.   DVT prophylaxis: Lovenox Code Status: Full Family Communication: None at bedside Disposition Plan: Will eventually need inpatient psychiatric hospitalization Consults called: Neurology by ED provider.  Will consult psychiatry Admission status: Inpatient/telemetry  Severity of Illness: The appropriate patient status for this patient is INPATIENT. Inpatient status is judged to be reasonable and necessary in order to provide the required intensity of service to ensure the patient's safety. The patient's presenting symptoms, physical exam findings, and initial radiographic and laboratory data in the context of their chronic comorbidities is felt to place them at high risk for further clinical deterioration. Furthermore, it is not anticipated that the patient will be medically stable for discharge from the hospital within 2 midnights of admission.   * I certify that at the point of admission it is my clinical judgment that the patient will require inpatient hospital care spanning beyond 2 midnights from the point of admission due to high intensity of service, high risk for further deterioration and high frequency of surveillance required.Glade Lloyd MD [facility]  08/02/2023, 8:26 AM

## 2023-08-03 LAB — CBC
HCT: 42.4 % (ref 39.0–52.0)
Hemoglobin: 14.7 g/dL (ref 13.0–17.0)
MCH: 32.4 pg (ref 26.0–34.0)
MCHC: 34.7 g/dL (ref 30.0–36.0)
MCV: 93.4 fL (ref 80.0–100.0)
Platelets: 335 10*3/uL (ref 150–400)
RBC: 4.54 MIL/uL (ref 4.22–5.81)
RDW: 12.2 % (ref 11.5–15.5)
WBC: 7.5 10*3/uL (ref 4.0–10.5)
nRBC: 0 % (ref 0.0–0.2)

## 2023-08-03 LAB — COMPREHENSIVE METABOLIC PANEL
ALT: 344 U/L — ABNORMAL HIGH (ref 0–44)
AST: 673 U/L — ABNORMAL HIGH (ref 15–41)
Albumin: 3.8 g/dL (ref 3.5–5.0)
Alkaline Phosphatase: 102 U/L (ref 38–126)
Anion gap: 16 — ABNORMAL HIGH (ref 5–15)
BUN: 5 mg/dL — ABNORMAL LOW (ref 6–20)
CO2: 27 mmol/L (ref 22–32)
Calcium: 9.4 mg/dL (ref 8.9–10.3)
Chloride: 94 mmol/L — ABNORMAL LOW (ref 98–111)
Creatinine, Ser: 0.69 mg/dL (ref 0.61–1.24)
GFR, Estimated: >60 mL/min (ref >60)
Glucose, Bld: 82 mg/dL (ref 70–99)
Potassium: 3.4 mmol/L — ABNORMAL LOW (ref 3.5–5.1)
Sodium: 137 mmol/L (ref 135–145)
Total Bilirubin: 1.5 mg/dL — ABNORMAL HIGH (ref 0.3–1.2)
Total Protein: 6.7 g/dL (ref 6.5–8.1)

## 2023-08-03 LAB — HIV ANTIBODY (ROUTINE TESTING W REFLEX): HIV Screen 4th Generation wRfx: NONREACTIVE

## 2023-08-03 LAB — CK: Total CK: 4594 U/L — ABNORMAL HIGH (ref 49–397)

## 2023-08-03 LAB — MAGNESIUM: Magnesium: 1.8 mg/dL (ref 1.7–2.4)

## 2023-08-03 MED ORDER — THIAMINE HCL 100 MG/ML IJ SOLN
500.0000 mg | Freq: Once | INTRAVENOUS | Status: AC
  Administered 2023-08-03: 500 mg via INTRAVENOUS
  Filled 2023-08-03: qty 5

## 2023-08-03 MED ORDER — POTASSIUM CHLORIDE CRYS ER 20 MEQ PO TBCR
40.0000 meq | EXTENDED_RELEASE_TABLET | Freq: Once | ORAL | Status: AC
  Administered 2023-08-03: 40 meq via ORAL
  Filled 2023-08-03: qty 2

## 2023-08-03 NOTE — Progress Notes (Signed)
NEUROLOGY CONSULT FOLLOW UP NOTE   Date of service: August 03, 2023 Patient Name: Stephen [name] MRN:  [id number] DOB:  June 20, 1991  Brief HPI  Stephen [name] is a 32 y.o. male  has a past medical history of Depression and Seizures (HCC). who presented with concern for seizure which is due to she is up with her hooked up to LTM EEG.   Interval Hx/subjective   No acute events overnight.  No seizure activity. Long-term EEG overnight normal.  Vitals   Vitals:   08/02/23 2029 08/02/23 2300 08/03/23 0407 08/03/23 0822  BP: (!) 134/90 (!) 130/92 121/80 (!) 122/97  Pulse: 91 94 96 94  Resp: 20 (!) 21 20 18   Temp: 98.1 F (36.7 C) 98.3 F (36.8 C) 98.1 F (36.7 C) 98.4 F (36.9 C)  TempSrc: Oral Oral Oral Oral  SpO2: 96% 97% 94% 96%  Weight:      Height:         Body mass index is 20.67 kg/m.  Physical Exam   GEN: Awake alert in no distress HEENT: Normocephalic atraumatic Lungs: Clear Cardiovascular: Regular rhythm Neurological exam Awake alert oriented x 3.  No dysarthria.  No aphasia.  Cranial nerves II to XII intact.  Motor examination with no drift or weakness.  Sensation intact light touch all over.  Coordination with no dysmetria.  Labs and Diagnostic Imaging   CBC:  Recent Labs  Lab 08/02/23 0038 08/03/23 0401  WBC 14.9* 7.5  HGB 16.4 14.7  HCT 46.0 42.4  MCV 93.9 93.4  PLT 388 335    Basic Metabolic Panel:  Lab Results  Component Value Date   NA 137 08/03/2023   K 3.4 (L) 08/03/2023   CO2 27 08/03/2023   GLUCOSE 82 08/03/2023   BUN 5 (L) 08/03/2023   CREATININE 0.69 08/03/2023   CALCIUM 9.4 08/03/2023   GFRNONAA >60 08/03/2023   GFRAA >60 09/16/2018   Urine Drug Screen:     Component Value Date/Time   LABOPIA NONE DETECTED 08/02/2023 0201   COCAINSCRNUR NONE DETECTED 08/02/2023 0201   LABBENZ NONE DETECTED 08/02/2023 0201   AMPHETMU NONE DETECTED 08/02/2023 0201   THCU POSITIVE (A) 08/02/2023 0201   LABBARB NONE DETECTED 08/02/2023 0201     Alcohol Level     Component Value Date/Time   ETH 248 (H) 08/02/2023 0038   No new imaging. Assessment    Stephen [name] is a 32 y.o. 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 The history provided by him in the emergency department and here is not very congruent but at this point, I think it is reasonable that he be evaluated further by an LTM EEG to characterize his spells so that of a definitive diagnosis could be made and he could be helped with the appropriate medication and management. No spell captured overnight.  Will continue LTM for 1 more day.   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   Recommendations  No antiepileptics for now Maintain seizure precautions Continue LTM EEG Further recommendations based on EEG results and clinical course CIWA protocol Plan discussed with Dr. Randol Kern. Neurology will continue to follow with you   Signed,   Milon Dikes, MD Neurology

## 2023-08-03 NOTE — Procedures (Signed)
Patient Name: Stephen [name]  MRN: [id number]  Epilepsy Attending: Charlsie Quest  Referring Physician/Provider: Milon Dikes, MD  Duration: 08/02/2023 2215 to 08/03/2023 2215  Patient history: 32 y.o. male significant history of depression and suicidal ideation, and self-reported history of seizures that started 2 years ago in the setting of stress admitted for evaluation of multiple seizure episodes over the past few days. EEG to evaluate for seizure  Level of alertness: Awake, asleep  AEDs during EEG study: LEV  Technical aspects: This EEG study was done with scalp electrodes positioned according to the 10-20 International system of electrode placement. Electrical activity was reviewed with band pass filter of 1-70Hz , sensitivity of 7 uV/mm, display speed of 19mm/sec with a 60Hz  notched filter applied as appropriate. EEG data were recorded continuously and digitally stored.  Video monitoring was available and reviewed as appropriate.  Description: The posterior dominant rhythm consists of 9-10 Hz activity of moderate voltage (25-35 uV) seen predominantly in posterior head regions, symmetric and reactive to eye opening and eye closing. Sleep was characterized by vertex waves, sleep spindles (12 to 14 Hz), maximal frontocentral region. Hyperventilation and photic stimulation were not performed.     IMPRESSION: This study is within normal limits. No seizures or epileptiform discharges were seen throughout the recording.  A normal interictal EEG does not exclude the diagnosis of epilepsy.  Iori Gigante [name]

## 2023-08-03 NOTE — Plan of Care (Signed)
  Problem: Education: Goal: Knowledge of General Education information will improve Description: Including pain rating scale, medication(s)/side effects and non-pharmacologic comfort measures Outcome: Progressing   Problem: Clinical Measurements: Goal: Ability to maintain clinical measurements within normal limits will improve Outcome: Progressing   Problem: Safety: Goal: Ability to remain free from injury will improve Outcome: Progressing   

## 2023-08-03 NOTE — Progress Notes (Signed)
PROGRESS NOTE    Stephen [name]  SAY:[id number] DOB: [birth year] DOA: 08/01/2023 PCP: Patient, No Pcp Per   Chief Complaint  Patient presents with   Seizures    Brief Narrative:   Stephen [name] is a 32 y.o. male with medical history significant of depression, seizures was brought from behavioral health urgent care with concern for seizures.  Patient is a poor historian and reliability of history is poor.  I have reviewed patient's medical records including ED providers documentation.  Apparently, he had 4 seizure-like episodes yesterday that he described like "spacing out, muscle stiffening, tensing up and shaking all over".  These episodes lasted for 10 to 15 minutes at a time apparently.  He has been on gabapentin for seizure disorder but has not seen a neurologist.  He apparently has been having seizures on and off for last 2 years.  He is sometimes aware of these episodes and sometimes not.  Denied any tongue bite or incontinence or head trauma.  No fever, vomiting, chest pain, shortness of breath, abdominal pain, diarrhea or dysuria. He was seen in behavioral health urgent care with concern for suicidal ideation and cutting himself with a box cutter and recommendations was made for inpatient psychiatric admission.   ED Course: Patient was IVC'd.  Ethanol level was 248.  CK of 4639.  CT head without contrast was negative for any acute intracranial abnormality.  ED provider discussed with on-call neurologist who recommended loading with Keppra and transferring to [facility] for continuous EEG.  He was also started on CIWA protocol.  Hospitalist service was called to evaluate the patient.  Assessment & Plan:   Principal Problem:   MDD (major depressive disorder), recurrent severe, without psychosis (HCC) Active Problems:   Seizure (HCC)   Suicidal ideation  Seizure-like activity -Neurology input greatly appreciated, he remains on LTM EEG -So far continue to hold on initiating  antiepileptics. -High suspicion for PNES -continue with seizure precaution    Alcohol abuse Concern for alcohol withdrawal -Continue CIWA protocol.  TOC consult   Suicidal ideation Depression -Presented to behavioral health urgent care with suicidal ideation and recommendations was for inpatient psychiatric hospitalization.  -Catheter input greatly appreciated, continue with one-to-one sitter -Continue with IVC, initiated in ED   Elevated LFTs -Possibly from alcohol abuse.  Monitor   Hyponatremia Mild.  Monitor.   Acute metabolic acidosis -Improving.  Monitor   Leukocytosis -Possibly reactive.  Repeat a.m. labs.   Elevated CK -CK remains elevated, but stable, continue with IV fluids   Hypokalemia -Repleted   Marijuana use -Urine drug screen positive for tetrahydrocannabinol.  TOC consult.      DVT prophylaxis: Lovenox Code Status: Full code Family Communication: None at bedside Disposition:   Status is: Inpatient    Consultants:  Neurology Psychiatry  Subjective:  No significant events overnight, he denies any complaints today, no seizures overnight  Objective: Vitals:   08/02/23 2300 08/03/23 0407 08/03/23 0822 08/03/23 1110  BP: (!) 130/92 121/80 (!) 122/97 (!) 127/97  Pulse: 94 96 94 99  Resp: (!) 21 20 18    Temp: 98.3 F (36.8 C) 98.1 F (36.7 C) 98.4 F (36.9 C)   TempSrc: Oral Oral Oral   SpO2: 97% 94% 96% 97%  Weight:      Height:        Intake/Output Summary (Last 24 hours) at 08/03/2023 1155 Last data filed at 08/03/2023 0900 Gross per 24 hour  Intake 980 ml  Output 200 ml  Net  780 ml   Filed Weights   08/02/23 1638  Weight: 63.5 kg    Examination:  Awake Alert, Oriented X 3, No new F.N deficits, Normal affect, he is connected to LTM EEG Symmetrical Chest wall movement, Good air movement bilaterally, CTAB RRR,No Gallops,Rubs or new Murmurs, No Parasternal Heave +ve B.Sounds, Abd Soft, No tenderness, No rebound - guarding  or rigidity. No Cyanosis, Clubbing or edema, No new Rash or bruise       Data Reviewed: I have personally reviewed following labs and imaging studies  CBC: Recent Labs  Lab 08/02/23 0038 08/03/23 0401  WBC 14.9* 7.5  HGB 16.4 14.7  HCT 46.0 42.4  MCV 93.9 93.4  PLT 388 335    Basic Metabolic Panel: Recent Labs  Lab 08/02/23 0038 08/02/23 0400 08/03/23 0401  NA 133* 132* 137  K 3.8 3.7 3.4*  CL 93* 93* 94*  CO2 18* 21* 27  GLUCOSE 74 105* 82  BUN 11 11 5*  CREATININE 0.71 0.78 0.69  CALCIUM 9.4 8.6* 9.4  MG 2.1  --  1.8    GFR: Estimated Creatinine Clearance: 120.2 mL/min (by C-G formula based on SCr of 0.69 mg/dL).  Liver Function Tests: Recent Labs  Lab 08/02/23 0038 08/02/23 0400 08/03/23 0401  AST 356* 312* 673*  ALT 186* 161* 344*  ALKPHOS 112 94 102  BILITOT 0.9 0.5 1.5*  PROT 7.9 6.3* 6.7  ALBUMIN 4.6 3.7 3.8    CBG: No results for input(s): "GLUCAP" in the last 168 hours.   No results found for this or any previous visit (from the past 240 hour(s)).       Radiology Studies: Overnight EEG with video  Result Date: 08/03/2023 Charlsie Quest, MD     08/03/2023  7:19 AM Patient Name: Stephen [name] MRN: [id number] Epilepsy Attending: Charlsie Quest Referring Physician/Provider: Milon Dikes, MD Duration: 08/02/2023 2215 to 08/03/2023 0715 Patient history: 32 y.o. male significant history of depression and suicidal ideation, and self-reported history of seizures that started 2 years ago in the setting of stress admitted for evaluation of multiple seizure episodes over the past few days. EEG to evaluate for seizure Level of alertness: Awake, asleep AEDs during EEG study: LEV Technical aspects: This EEG study was done with scalp electrodes positioned according to the 10-20 International system of electrode placement. Electrical activity was reviewed with band pass filter of 1-70Hz , sensitivity of 7 uV/mm, display speed of 68mm/sec with a 60Hz   notched filter applied as appropriate. EEG data were recorded continuously and digitally stored.  Video monitoring was available and reviewed as appropriate. Description: The posterior dominant rhythm consists of 9-10 Hz activity of moderate voltage (25-35 uV) seen predominantly in posterior head regions, symmetric and reactive to eye opening and eye closing. Sleep was characterized by vertex waves, sleep spindles (12 to 14 Hz), maximal frontocentral region. Hyperventilation and photic stimulation were not performed.   IMPRESSION: This study is within normal limits. No seizures or epileptiform discharges were seen throughout the recording. A normal interictal EEG does not exclude the diagnosis of epilepsy. Charlsie Quest   CT Head Wo Contrast  Result Date: 08/02/2023 CLINICAL DATA:  Seizure.  Headache EXAM: CT HEAD WITHOUT CONTRAST TECHNIQUE: Contiguous axial images were obtained from the base of the skull through the vertex without intravenous contrast. RADIATION DOSE REDUCTION: This exam was performed according to the departmental dose-optimization program which includes automated exposure control, adjustment of the mA and/or kV according to patient size and/or  use of iterative reconstruction technique. COMPARISON:  10/28/2022 FINDINGS: Brain: No acute intracranial abnormality. Specifically, no hemorrhage, hydrocephalus, mass lesion, acute infarction, or significant intracranial injury. Vascular: No hyperdense vessel or unexpected calcification. Skull: No acute calvarial abnormality. Sinuses/Orbits: No acute findings Other: None IMPRESSION: Normal study. Electronically Signed   By: Charlett Nose M.D.   On: 08/02/2023 02:47        Scheduled Meds:  enoxaparin (LOVENOX) injection  40 mg Subcutaneous Q24H   folic acid  1 mg Oral Daily   LORazepam  0-4 mg Intravenous Q6H   Followed by   Melene Muller ON 08/04/2023] LORazepam  0-4 mg Intravenous Q12H   multivitamin with minerals  1 tablet Oral Daily    thiamine  100 mg Oral Daily   Or   thiamine  100 mg Intravenous Daily   Continuous Infusions:  levETIRAcetam Stopped (08/03/23 0625)     LOS: 1 day        Huey Bienenstock, MD [facility]   To contact the attending provider between 7A-7P or the covering provider during after hours 7P-7A, please log into the web site [email or web address] and access using universal [place] password for that web site. If you do not have the password, please call the hospital operator.  08/03/2023, 11:55 AM

## 2023-08-04 DIAGNOSIS — F332 Major depressive disorder, recurrent severe without psychotic features: Secondary | ICD-10-CM

## 2023-08-04 DIAGNOSIS — R45851 Suicidal ideations: Secondary | ICD-10-CM

## 2023-08-04 LAB — CBC
HCT: 47.3 % (ref 39.0–52.0)
Hemoglobin: 16 g/dL (ref 13.0–17.0)
MCH: 32.2 pg (ref 26.0–34.0)
MCHC: 33.8 g/dL (ref 30.0–36.0)
MCV: 95.2 fL (ref 80.0–100.0)
Platelets: 286 10*3/uL (ref 150–400)
RBC: 4.97 MIL/uL (ref 4.22–5.81)
RDW: 12.1 % (ref 11.5–15.5)
WBC: 6.9 10*3/uL (ref 4.0–10.5)
nRBC: 0 % (ref 0.0–0.2)

## 2023-08-04 LAB — COMPREHENSIVE METABOLIC PANEL
ALT: 312 U/L — ABNORMAL HIGH (ref 0–44)
AST: 389 U/L — ABNORMAL HIGH (ref 15–41)
Albumin: 4.1 g/dL (ref 3.5–5.0)
Alkaline Phosphatase: 126 U/L (ref 38–126)
Anion gap: 13 (ref 5–15)
BUN: 7 mg/dL (ref 6–20)
CO2: 29 mmol/L (ref 22–32)
Calcium: 10.2 mg/dL (ref 8.9–10.3)
Chloride: 96 mmol/L — ABNORMAL LOW (ref 98–111)
Creatinine, Ser: 0.79 mg/dL (ref 0.61–1.24)
GFR, Estimated: >60 mL/min (ref >60)
Glucose, Bld: 103 mg/dL — ABNORMAL HIGH (ref 70–99)
Potassium: 4 mmol/L (ref 3.5–5.1)
Sodium: 138 mmol/L (ref 135–145)
Total Bilirubin: 0.8 mg/dL (ref 0.3–1.2)
Total Protein: 7.4 g/dL (ref 6.5–8.1)

## 2023-08-04 NOTE — Plan of Care (Signed)
  Problem: Education: Goal: Knowledge of General Education information will improve Description: Including pain rating scale, medication(s)/side effects and non-pharmacologic comfort measures Outcome: Progressing   Problem: Clinical Measurements: Goal: Ability to maintain clinical measurements within normal limits will improve Outcome: Progressing   Problem: Safety: Goal: Ability to remain free from injury will improve Outcome: Progressing   

## 2023-08-04 NOTE — Progress Notes (Signed)
LTM EEG discontinued - no skin breakdown at unhook.  

## 2023-08-04 NOTE — Progress Notes (Signed)
PROGRESS NOTE    Stephen [name]  JXB:[id number] DOB: Feb 07, 1991 DOA: 08/01/2023 PCP: Patient, No Pcp Per   Chief Complaint  Patient presents with   Seizures    Brief Narrative:   Stephen [name] is a 32 y.o. male with medical history significant of depression, seizures was brought from behavioral health urgent care with concern for seizures.  he had 4 episodes of seizure-like episodes  that he described like "spacing out, muscle stiffening, tensing up and shaking all over".  These episodes lasted for 10 to 15 minutes at a time apparently.  He has been on gabapentin for seizure disorder but has not seen a neurologist.    Denied any tongue bite or incontinence or head trauma.  No fever, vomiting, chest pain, shortness of breath, abdominal pain, diarrhea or dysuria. He was seen in behavioral health urgent care with concern for suicidal ideation and cutting himself with a box cutter and recommendations was made for inpatient psychiatric admission.  Patient was sent to [facility], ED for evaluation regarding his seizures, as well he was IVC'd.  Ethanol level was 248.  CK of 4639.  CT head without contrast was negative for any acute intracranial abnormality.  ED provider discussed with on-call neurologist who recommended loading with Keppra and transferring to [facility] for continuous EEG.  He was also started on CIWA protocol.  Hospitalist service was called to evaluate the patient.  Assessment & Plan:   Principal Problem:   MDD (major depressive disorder), recurrent severe, without psychosis (HCC) Active Problems:   Seizure (HCC)   Suicidal ideation  Seizure-like activity -Neurology input greatly appreciated, he remains on LTM EEG -So far continue to hold on initiating antiepileptics. -High suspicion for PNES -continue with seizure precaution -Have discussed with neurology, LTM EEG likely will be discontinued today if he has no evidence of seizure activities, recommendations to initiate any  AED, as if his events felt to be secondary to nonepileptic seizures   Alcohol abuse Concern for alcohol withdrawal -Continue CIWA protocol.  TOC consult   Suicidal ideation Depression -Presented to behavioral health urgent care with suicidal ideation and recommendations was for inpatient psychiatric hospitalization.  -Catheter input greatly appreciated, continue with one-to-one sitter -Continue with IVC, initiated in ED -Patient is medically cleared for inpatient psych admission.   Elevated LFTs -Possibly from alcohol abuse.  Monitor   Hyponatremia Mild.  Monitor.   Acute metabolic acidosis -Improving.  Monitor   Leukocytosis -Possibly reactive.  Repeat a.m. labs.   Elevated CK -CK remains elevated, but stable, continue with IV fluids   Hypokalemia -Repleted   Marijuana use -Urine drug screen positive for tetrahydrocannabinol.  TOC consult.      DVT prophylaxis: Lovenox Code Status: Full code Family Communication: None at bedside Disposition: Patient is medically cleared at this point for inpatient psych admission.  Status is: Inpatient    Consultants:  Neurology Psychiatry  Subjective:  No significant events overnight, he denies any complaints today, there was no seizures activities noted overnight.  Objective: Vitals:   08/03/23 2100 08/03/23 2331 08/04/23 0439 08/04/23 0823  BP:  131/86 (!) 128/95 (!) 127/92  Pulse: (!) 105 100 100 99  Resp:  19 20 20   Temp:  97.7 F (36.5 C) 98 F (36.7 C) 98.8 F (37.1 C)  TempSrc:  Oral Oral Oral  SpO2:  98% 100% 99%  Weight:      Height:        Intake/Output Summary (Last 24 hours) at 08/04/2023  1109 Last data filed at 08/04/2023 0647 Gross per 24 hour  Intake 240 ml  Output 750 ml  Net -510 ml   Filed Weights   08/02/23 1638  Weight: 63.5 kg    Examination:  Awake Alert, Oriented X 3, No new F.N deficits, Normal affect, he is connected to LTM EEG Symmetrical Chest wall movement, Good air  movement bilaterally, CTAB RRR,No Gallops,Rubs or new Murmurs, No Parasternal Heave +ve B.Sounds, Abd Soft, No tenderness, No rebound - guarding or rigidity. No Cyanosis, Clubbing or edema, No new Rash or bruise       Data Reviewed: I have personally reviewed following labs and imaging studies  CBC: Recent Labs  Lab 08/02/23 0038 08/03/23 0401 08/04/23 0607  WBC 14.9* 7.5 6.9  HGB 16.4 14.7 16.0  HCT 46.0 42.4 47.3  MCV 93.9 93.4 95.2  PLT 388 335 286    Basic Metabolic Panel: Recent Labs  Lab 08/02/23 0038 08/02/23 0400 08/03/23 0401 08/04/23 0607  NA 133* 132* 137 138  K 3.8 3.7 3.4* 4.0  CL 93* 93* 94* 96*  CO2 18* 21* 27 29  GLUCOSE 74 105* 82 103*  BUN 11 11 5* 7  CREATININE 0.71 0.78 0.69 0.79  CALCIUM 9.4 8.6* 9.4 10.2  MG 2.1  --  1.8  --     GFR: Estimated Creatinine Clearance: 120.2 mL/min (by C-G formula based on SCr of 0.79 mg/dL).  Liver Function Tests: Recent Labs  Lab 08/02/23 0038 08/02/23 0400 08/03/23 0401 08/04/23 0607  AST 356* 312* 673* 389*  ALT 186* 161* 344* 312*  ALKPHOS 112 94 102 126  BILITOT 0.9 0.5 1.5* 0.8  PROT 7.9 6.3* 6.7 7.4  ALBUMIN 4.6 3.7 3.8 4.1    CBG: No results for input(s): "GLUCAP" in the last 168 hours.   No results found for this or any previous visit (from the past 240 hour(s)).       Radiology Studies: Overnight EEG with video  Result Date: 08/03/2023 Charlsie Quest, MD     08/04/2023  7:46 AM Patient Name: Stephen [name] MRN: [id number] Epilepsy Attending: Charlsie Quest Referring Physician/Provider: Milon Dikes, MD Duration: 08/02/2023 2215 to 08/03/2023 2215 Patient history: 32 y.o. male significant history of depression and suicidal ideation, and self-reported history of seizures that started 2 years ago in the setting of stress admitted for evaluation of multiple seizure episodes over the past few days. EEG to evaluate for seizure Level of alertness: Awake, asleep AEDs during EEG study:  LEV Technical aspects: This EEG study was done with scalp electrodes positioned according to the 10-20 International system of electrode placement. Electrical activity was reviewed with band pass filter of 1-70Hz , sensitivity of 7 uV/mm, display speed of 33mm/sec with a 60Hz  notched filter applied as appropriate. EEG data were recorded continuously and digitally stored.  Video monitoring was available and reviewed as appropriate. Description: The posterior dominant rhythm consists of 9-10 Hz activity of moderate voltage (25-35 uV) seen predominantly in posterior head regions, symmetric and reactive to eye opening and eye closing. Sleep was characterized by vertex waves, sleep spindles (12 to 14 Hz), maximal frontocentral region. Hyperventilation and photic stimulation were not performed.   IMPRESSION: This study is within normal limits. No seizures or epileptiform discharges were seen throughout the recording. A normal interictal EEG does not exclude the diagnosis of epilepsy. Priyanka [name]        Scheduled Meds:  enoxaparin (LOVENOX) injection  40 mg Subcutaneous Q24H  folic acid  1 mg Oral Daily   LORazepam  0-4 mg Intravenous Q12H   multivitamin with minerals  1 tablet Oral Daily   thiamine  100 mg Oral Daily   Or   thiamine  100 mg Intravenous Daily   Continuous Infusions:     LOS: 2 days        Huey Bienenstock, MD [facility]   To contact the attending provider between 7A-7P or the covering provider during after hours 7P-7A, please log into the web site [email or web address] and access using universal [place] password for that web site. If you do not have the password, please call the hospital operator.  08/04/2023, 11:09 AM

## 2023-08-04 NOTE — Procedures (Addendum)
Patient Name: Stephen [name]  MRN: [id number]  Epilepsy Attending: Charlsie Quest  Referring Physician/Provider: Milon Dikes, MD  Duration: 08/03/2023 2215 to 08/04/2023 1016   Patient history: 32 y.o. male significant history of depression and suicidal ideation, and self-reported history of seizures that started 2 years ago in the setting of stress admitted for evaluation of multiple seizure episodes over the past few days. EEG to evaluate for seizure   Level of alertness: Awake, asleep   AEDs during EEG study: None   Technical aspects: This EEG study was done with scalp electrodes positioned according to the 10-20 International system of electrode placement. Electrical activity was reviewed with band pass filter of 1-70Hz , sensitivity of 7 uV/mm, display speed of 5mm/sec with a 60Hz  notched filter applied as appropriate. EEG data were recorded continuously and digitally stored.  Video monitoring was available and reviewed as appropriate.   Description: The posterior dominant rhythm consists of 9-10 Hz activity of moderate voltage (25-35 uV) seen predominantly in posterior head regions, symmetric and reactive to eye opening and eye closing. Sleep was characterized by vertex waves, sleep spindles (12 to 14 Hz), maximal frontocentral region. Hyperventilation and photic stimulation were not performed.      IMPRESSION: This study is within normal limits. No seizures or epileptiform discharges were seen throughout the recording.   A normal interictal EEG does not exclude the diagnosis of epilepsy.   Stephen [name] [name]

## 2023-08-04 NOTE — Progress Notes (Signed)
NEUROLOGY CONSULT FOLLOW UP NOTE   Date of service: August 04, 2023 Patient Name: Stephen [name] MRN:  [id number] DOB:  1991-09-19  Brief HPI  Stephen [name] is a 32 y.o. male  has a past medical history of Depression and Seizures (HCC). who presented with concern for seizure which is due to she is up with her hooked up to LTM EEG.   Interval Hx/subjective   No acute events overnight.  No seizure activity. Long-term EEG overnight normal.  Vitals   Vitals:   08/03/23 2100 08/03/23 2331 08/04/23 0439 08/04/23 0823  BP:  131/86 (!) 128/95 (!) 127/92  Pulse: (!) 105 100 100 99  Resp:  19 20 20   Temp:  97.7 F (36.5 C) 98 F (36.7 C) 98.8 F (37.1 C)  TempSrc:  Oral Oral Oral  SpO2:  98% 100% 99%  Weight:      Height:         Body mass index is 20.67 kg/m.  Physical Exam   GEN: Awake alert in no distress HEENT: Normocephalic atraumatic Lungs: Clear Cardiovascular: Regular rhythm Neurological exam Awake alert oriented x 3.  No dysarthria.  No aphasia.  Cranial nerves II to XII intact.  Motor examination with no drift or weakness.  Sensation intact light touch all over.  Coordination with no dysmetria. No change in exam  Labs and Diagnostic Imaging   CBC:  Recent Labs  Lab 08/03/23 0401 08/04/23 0607  WBC 7.5 6.9  HGB 14.7 16.0  HCT 42.4 47.3  MCV 93.4 95.2  PLT 335 286    Basic Metabolic Panel:  Lab Results  Component Value Date   NA 138 08/04/2023   K 4.0 08/04/2023   CO2 29 08/04/2023   GLUCOSE 103 (H) 08/04/2023   BUN 7 08/04/2023   CREATININE 0.79 08/04/2023   CALCIUM 10.2 08/04/2023   GFRNONAA >60 08/04/2023   GFRAA >60 09/16/2018   Urine Drug Screen:     Component Value Date/Time   LABOPIA NONE DETECTED 08/02/2023 0201   COCAINSCRNUR NONE DETECTED 08/02/2023 0201   LABBENZ NONE DETECTED 08/02/2023 0201   AMPHETMU NONE DETECTED 08/02/2023 0201   THCU POSITIVE (A) 08/02/2023 0201   LABBARB NONE DETECTED 08/02/2023 0201    Alcohol Level      Component Value Date/Time   ETH 248 (H) 08/02/2023 0038   No new imaging. Assessment    Stephen [name] is a 32 y.o. 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 Has been on LTM EEG now for 2 days with no spells. At this point, I think it is reasonable to discontinue LTM EEG. 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 I would recommend a formal outpatient epilepsy monitoring unit admission for spell characterization  Recommendations  No antiepileptics for now Maintain seizure precautions Discontinue LTM Management of anxiety, depression and other psychiatric issues per the psychiatry team Outpatient neurology follow-up in 8 to 12 weeks for coordination of epilepsy monitoring unit admission for spell characterization Plan was discussed with Dr. Randol Kern Neurology will sign off for now, please call with questions as needed.   --  Milon Dikes, MD Neurology

## 2023-08-05 DIAGNOSIS — F332 Major depressive disorder, recurrent severe without psychotic features: Secondary | ICD-10-CM | POA: Diagnosis not present

## 2023-08-05 LAB — COMPREHENSIVE METABOLIC PANEL
ALT: 232 U/L — ABNORMAL HIGH (ref 0–44)
AST: 203 U/L — ABNORMAL HIGH (ref 15–41)
Albumin: 3.8 g/dL (ref 3.5–5.0)
Alkaline Phosphatase: 113 U/L (ref 38–126)
Anion gap: 14 (ref 5–15)
BUN: 9 mg/dL (ref 6–20)
CO2: 27 mmol/L (ref 22–32)
Calcium: 9.7 mg/dL (ref 8.9–10.3)
Chloride: 95 mmol/L — ABNORMAL LOW (ref 98–111)
Creatinine, Ser: 0.78 mg/dL (ref 0.61–1.24)
GFR, Estimated: >60 mL/min (ref >60)
Glucose, Bld: 112 mg/dL — ABNORMAL HIGH (ref 70–99)
Potassium: 3.4 mmol/L — ABNORMAL LOW (ref 3.5–5.1)
Sodium: 136 mmol/L (ref 135–145)
Total Bilirubin: 0.8 mg/dL (ref 0.3–1.2)
Total Protein: 7 g/dL (ref 6.5–8.1)

## 2023-08-05 LAB — CBC
HCT: 46.3 % (ref 39.0–52.0)
Hemoglobin: 15.9 g/dL (ref 13.0–17.0)
MCH: 33 pg (ref 26.0–34.0)
MCHC: 34.3 g/dL (ref 30.0–36.0)
MCV: 96.1 fL (ref 80.0–100.0)
Platelets: 293 10*3/uL (ref 150–400)
RBC: 4.82 MIL/uL (ref 4.22–5.81)
RDW: 11.9 % (ref 11.5–15.5)
WBC: 7.5 10*3/uL (ref 4.0–10.5)
nRBC: 0 % (ref 0.0–0.2)

## 2023-08-05 LAB — TSH: TSH: 2.803 u[IU]/mL (ref 0.350–4.500)

## 2023-08-05 LAB — T4, FREE: Free T4: 1.03 ng/dL (ref 0.61–1.12)

## 2023-08-05 LAB — MAGNESIUM: Magnesium: 1.8 mg/dL (ref 1.7–2.4)

## 2023-08-05 MED ORDER — FLUOXETINE HCL 10 MG PO CAPS
10.0000 mg | ORAL_CAPSULE | Freq: Every day | ORAL | Status: DC
  Administered 2023-08-06: 10 mg via ORAL
  Filled 2023-08-05: qty 1

## 2023-08-05 MED ORDER — SODIUM CHLORIDE 0.9 % IV BOLUS
500.0000 mL | Freq: Once | INTRAVENOUS | Status: AC
  Administered 2023-08-05: 500 mL via INTRAVENOUS

## 2023-08-05 MED ORDER — NICOTINE 14 MG/24HR TD PT24
14.0000 mg | MEDICATED_PATCH | Freq: Every day | TRANSDERMAL | Status: DC
  Administered 2023-08-06 (×2): 14 mg via TRANSDERMAL
  Filled 2023-08-05 (×2): qty 1

## 2023-08-05 MED ORDER — POTASSIUM CHLORIDE CRYS ER 20 MEQ PO TBCR
40.0000 meq | EXTENDED_RELEASE_TABLET | Freq: Four times a day (QID) | ORAL | Status: AC
  Administered 2023-08-05 (×2): 40 meq via ORAL
  Filled 2023-08-05 (×2): qty 2

## 2023-08-05 MED ORDER — DEXTROSE-SODIUM CHLORIDE 5-0.45 % IV SOLN: INTRAVENOUS | Status: DC

## 2023-08-05 MED ORDER — METOPROLOL TARTRATE 25 MG PO TABS
25.0000 mg | ORAL_TABLET | Freq: Two times a day (BID) | ORAL | Status: DC
  Administered 2023-08-05 – 2023-08-06 (×3): 25 mg via ORAL
  Filled 2023-08-05 (×3): qty 1

## 2023-08-05 NOTE — TOC Initial Note (Signed)
Transition of Care [facility]) - Initial/Assessment Note    Patient Details  Name: Stephen [name] MRN: [id number] Date of Birth: 07-14-1991  [facility]) CM/SW Contact:    Mearl Latin, LCSW Phone Number: 08/05/2023, 9:05 AM  Clinical Narrative:                 Patient transferred to [facility] for ongoing medical workup prior to expected discharge to [facility]. Patient is IVC'd. CSW will continue to follow.   Expected Discharge Plan: Psychiatric Hospital Barriers to Discharge: Continued Medical Work up   Patient Goals and CMS Choice Patient states their goals for this hospitalization and ongoing recovery are:: Stablization          Expected Discharge Plan and Services In-house Referral: Clinical Social Work     Living arrangements for the past 2 months: Single Family Home                                      Prior Living Arrangements/Services Living arrangements for the past 2 months: Single Family Home Lives with:: Self Patient language and need for interpreter reviewed:: Yes Do you feel safe going back to the place where you live?: Yes      Need for Family Participation in Patient Care: Yes (Comment) Care giver support system in place?: No (comment)   Criminal Activity/Legal Involvement Pertinent to Current Situation/Hospitalization: No - Comment as needed  Activities of Daily Living   ADL Screening (condition at time of admission) Independently performs ADLs?: Yes (appropriate for developmental age) Is the patient deaf or have difficulty hearing?: No Does the patient have difficulty seeing, even when wearing glasses/contacts?: No Does the patient have difficulty concentrating, remembering, or making decisions?: No  Permission Sought/Granted                  Emotional Assessment Appearance:: Appears stated age     Orientation: : Oriented to Self, Oriented to Place, Oriented to  Time, Oriented to Situation Alcohol / Substance Use: Alcohol Use,  Illicit Drugs Psych Involvement: Yes (comment) (IVC'd)  Admission diagnosis:  Seizure (HCC) [R56.9] Seizure-like activity (HCC) [R56.9] Alcoholic intoxication without complication (HCC) [F10.920] Non-traumatic rhabdomyolysis [M62.82] Patient Active Problem List   Diagnosis Date Noted   Seizure-like activity (HCC) 08/02/2023   MDD (major depressive disorder), recurrent severe, without psychosis (HCC) 08/02/2023   Suicidal ideation 08/02/2023   PCP:  Patient, No Pcp Per Pharmacy:   [id number] - [place], [place] - [address] [place] [address] Phone: [phone] Fax: [phone]  [id number] - [place], [place] - [address] AT [facility] [address] [place] [address] Phone: [phone] Fax: [phone]     Social Determinants of Health (SDOH) Social History: SDOH Screenings   Food Insecurity: No Food Insecurity (08/02/2023)  Housing: Low Risk  (08/02/2023)  Transportation Needs: No Transportation Needs (08/02/2023)  Utilities: Not At Risk (08/02/2023)  Tobacco Use: High Risk (08/02/2023)   SDOH Interventions:     Readmission Risk Interventions     No data to display

## 2023-08-05 NOTE — Progress Notes (Signed)
PROGRESS NOTE    Stephen [name]  NFA:[id number] DOB: September 25, 1991 DOA: 08/01/2023 PCP: Patient, No Pcp Per   Chief Complaint  Patient presents with   Seizures    Brief Narrative:   Stephen [name] is a 32 y.o. male with medical history significant of depression, seizures was brought from behavioral health urgent care with concern for seizures.  he had 4 episodes of seizure-like episodes  that he described like "spacing out, muscle stiffening, tensing up and shaking all over".  These episodes lasted for 10 to 15 minutes at a time apparently.  He has been on gabapentin for seizure disorder but has not seen a neurologist.    Denied any tongue bite or incontinence or head trauma.  No fever, vomiting, chest pain, shortness of breath, abdominal pain, diarrhea or dysuria. He was seen in behavioral health urgent care with concern for suicidal ideation and cutting himself with a box cutter and recommendations was made for inpatient psychiatric admission.  Patient was sent to [facility], ED for evaluation regarding his seizures, as well he was IVC'd.  Ethanol level was 248.  CK of 4639.  CT head without contrast was negative for any acute intracranial abnormality.  ED provider discussed with on-call neurologist who recommended loading with Keppra and transferring to [facility] for continuous EEG.  He was also started on CIWA protocol.  Hospitalist service was called to evaluate the patient.  Assessment & Plan:   Principal Problem:   MDD (major depressive disorder), recurrent severe, without psychosis (HCC) Active Problems:   Seizure-like activity (HCC)   Suicidal ideation  Seizure-like activity -Neurology input greatly appreciated, he remains on LTM EEG -So far continue to hold on initiating antiepileptics. -High suspicion for PNES -continue with seizure precaution -Neurology input greatly stated, presents of seizures on LTM EEG, this is most likely related to nonepileptic seizures, stress related,  no indication to initiate AED.    Alcohol abuse Concern for alcohol withdrawal -Continue CIWA protocol.  TOC consult  Sinus tachycardia -This is most likely in the setting of alcohol withdrawals, TSH within normal limit, received IV hydration, will start low-dose beta-blockers as well which should assist both with withdrawals and tachycardia.   Suicidal ideation Depression -Presented to behavioral health urgent care with suicidal ideation and recommendations was for inpatient psychiatric hospitalization.  -Catheter input greatly appreciated, continue with one-to-one sitter -Continue with IVC, initiated in ED -Patient is medically cleared for inpatient psych admission.  No beds available at [facility] today as discussed with psychiatry,   Elevated LFTs -Secondary to alcohol abuse, trending down.   Hyponatremia Mild.  Monitor.   Acute metabolic acidosis -Improving.  Monitor   Leukocytosis -Possibly reactive.  Repeat a.m. labs.   Elevated CK -CK remains elevated, but stable, continue with IV fluids   Hypokalemia -Repleted   Marijuana use -Urine drug screen positive for tetrahydrocannabinol.  TOC consult.      DVT prophylaxis: Lovenox Code Status: Full code Family Communication: None at bedside Disposition: Patient is medically cleared at this point for inpatient psych admission.  Status is: Inpatient    Consultants:  Neurology Psychiatry  Subjective:  No significant events overnight, he denies any complaints today, there was no seizures activities noted overnight.  Objective: Vitals:   08/04/23 2002 08/04/23 2331 08/05/23 0538 08/05/23 0631  BP: 118/88 116/79 (!) 120/92   Pulse: (!) 106 88 96   Resp:      Temp:    98.4 F (36.9 C)  TempSrc:  Oral  SpO2: 96% 98% 99%   Weight:      Height:        Intake/Output Summary (Last 24 hours) at 08/05/2023 1358 Last data filed at 08/04/2023 2000 Gross per 24 hour  Intake 240 ml  Output --  Net 240 ml   Filed  Weights   08/02/23 1638  Weight: 63.5 kg    Examination: Awake Alert, Oriented X 3, No new F.N deficits, Normal affect Symmetrical Chest wall movement, Good air movement bilaterally, CTAB RRR,No Gallops,Rubs or new Murmurs, No Parasternal Heave +ve B.Sounds, Abd Soft, No tenderness, No rebound - guarding or rigidity. No Cyanosis, Clubbing or edema, No new Rash or bruise       Data Reviewed: I have personally reviewed following labs and imaging studies  CBC: Recent Labs  Lab 08/02/23 0038 08/03/23 0401 08/04/23 0607 08/05/23 0946  WBC 14.9* 7.5 6.9 7.5  HGB 16.4 14.7 16.0 15.9  HCT 46.0 42.4 47.3 46.3  MCV 93.9 93.4 95.2 96.1  PLT 388 335 286 293    Basic Metabolic Panel: Recent Labs  Lab 08/02/23 0038 08/02/23 0400 08/03/23 0401 08/04/23 0607 08/05/23 0946  NA 133* 132* 137 138 136  K 3.8 3.7 3.4* 4.0 3.4*  CL 93* 93* 94* 96* 95*  CO2 18* 21* 27 29 27   GLUCOSE 74 105* 82 103* 112*  BUN 11 11 5* 7 9  CREATININE 0.71 0.78 0.69 0.79 0.78  CALCIUM 9.4 8.6* 9.4 10.2 9.7  MG 2.1  --  1.8  --  1.8    GFR: Estimated Creatinine Clearance: 120.2 mL/min (by C-G formula based on SCr of 0.78 mg/dL).  Liver Function Tests: Recent Labs  Lab 08/02/23 0038 08/02/23 0400 08/03/23 0401 08/04/23 0607 08/05/23 0946  AST 356* 312* 673* 389* 203*  ALT 186* 161* 344* 312* 232*  ALKPHOS 112 94 102 126 113  BILITOT 0.9 0.5 1.5* 0.8 0.8  PROT 7.9 6.3* 6.7 7.4 7.0  ALBUMIN 4.6 3.7 3.8 4.1 3.8    CBG: No results for input(s): "GLUCAP" in the last 168 hours.   No results found for this or any previous visit (from the past 240 hour(s)).       Radiology Studies: No results found.      Scheduled Meds:  enoxaparin (LOVENOX) injection  40 mg Subcutaneous Q24H   folic acid  1 mg Oral Daily   LORazepam  0-4 mg Intravenous Q12H   multivitamin with minerals  1 tablet Oral Daily   thiamine  100 mg Oral Daily   Or   thiamine  100 mg Intravenous Daily   Continuous  Infusions:     LOS: 3 days        Huey Bienenstock, MD [facility]   To contact the attending provider between 7A-7P or the covering provider during after hours 7P-7A, please log into the web site [email or web address] and access using universal [place] password for that web site. If you do not have the password, please call the hospital operator.  08/05/2023, 1:58 PM

## 2023-08-05 NOTE — Plan of Care (Signed)

## 2023-08-05 NOTE — Consult Note (Addendum)
Stephen [name] Psychiatry Consult Evaluation  Service Date: August 05, 2023 LOS:  LOS: 3 days   Primary Psychiatric Diagnoses  MDD 2.  GAD  Assessment  Stephen [name] is a 32 y.o. male admitted medically for 08/01/2023 11:35 PM for seizure-like activity. He carries the psychiatric diagnoses of MDD and has a past medical history of  seizures. Psychiatry was consulted for "suicidal ideation, IVCed, he is medically cleared to go to [facility]" by Elgergawy, Leana Roe, MD.  His current presentation of suicidal ideation and reported not discussing suicidal ideation due to not wanting to be admitted is most consistent with suspected MDD. He also endorses many symptoms consistent with GAD including his ongoing anxiety, feeling on edge, increased muscle tension and he has occasional panic attacks. He reports that he gets palpitations, nervous sweats, feeling of pressure on his chest. Current outpatient psychotropic medications include none and historically he has had a good response to taking prozac in the past. On initial examination, patient not reporting depressive symptoms though he does admit to [name] the suicidal ideations which he reports occurred in a moment where he felt very stressed. He was under IVC per information from collateral and we will continue IVC at this time and recommend inpatient psychiatric hospitalization. Unfortunately, no bed available at this time, patient will be reviewed again tomorrow. Also recommend starting prozac given prior benefit to the patient and to help with his anxiety symptoms. Please see plan below for detailed recommendations.   Diagnoses:  Active Hospital problems: Principal Problem:   MDD (major depressive disorder), recurrent severe, without psychosis (HCC) Active Problems:   Seizure-like activity (HCC)   Suicidal ideation     Plan   ## Psychiatric Medication Recommendations:  -- start prozac 10mg  daily for depression and anxiety  -- start nicotine patch for  tobacco use disorder   ## Medical Decision Making Capacity:  -- not formally assessed   ## Further Work-up:  -- per primary  -- most recent EKG on 10/21 had QtC of 442 -- Pertinent labwork reviewed earlier this admission includes: CK 1610>9604, CBC wnl, AST 203>389, ALT 232>312, TSH wnl, UDS+THC, ethanol 248, CT negative   ## Disposition:  -- We recommend inpatient psychiatric hospitalization after medical hospitalization. Patient has been involuntarily committed on 10/18.   ## Behavioral / Environmental:  --   No specific recommendations at this time.   ## Safety and Observation Level:  - Based on my clinical evaluation, I estimate the patient to be at high risk of self harm in the current setting - At this time, we recommend a 1:1 level of observation. This decision is based on my review of the chart including patient's history and current presentation, interview of the patient, mental status examination, and consideration of suicide risk including evaluating suicidal ideation, plan, intent, suicidal or self-harm behaviors, risk factors, and protective factors. This judgment is based on our ability to directly address suicide risk, implement suicide prevention strategies and develop a safety plan while the patient is in the clinical setting. Please contact our team if there is a concern that risk level has changed.  Suicide risk assessment  Patient has following modifiable risk factors for suicide: untreated depression and lack of access to outpatient mental health resources, which we are addressing by starting prozac, getting him to the psychiatric hospital.   Patient has following non-modifiable or demographic risk factors for suicide: male gender  Patient has the following protective factors against suicide: Supportive family, Supportive friends, and no  history of NSSIB   Thank you for this consult request. Recommendations have been communicated to the primary team.  We will  continue to follow at this time.   Karie Fetch, MD, PGY-2  Psychiatric and Social History   Relevant Aspects of Hospital Course:  Admitted on 08/01/2023 for seizures.  -Ethanol level on admission 248 -CK 4639, UDS+THC -CT negative  -loaded on keppra, transferred to [facility] for continuous EEG  -Patient noted SI prior to admission at [facility], cutting self with box cutter and stated patient was going to hang himself per wife  -Neurology consult, suspicious for PNES, LTM EEG wnl, advise no epileptics  -CK 4594, AST 389, ALT 312  Patient Report:  Patient is seen lying in bed. He is pleasant on approach. He reports feeling stressed, he reports he was one week away from taking vacation from job but then ended up [name] seizures at work, reports [name] a Psychologist, counselling. Reports now is more concerned about his job and cats. He reports he does regularly drink alcohol, reports he went and drank some alcohol, 2-2.5 shots of liquor because he didn't want to have a panic attack when being handcuffed. Stressed out due to [name] seizures. Girlfriend did wellness check. Right now he reports he is stressed due to aunt being in the hospital   He denies depressive symptoms, he admits that he may have stated suicidal ideation in the past when confronted about girlfriend report that he would cut himself with a box cutter. Reports he doesn't like sharp objects, no scratches or scars. When asked about him stating that he was going to hang himself, he reports he stated that he was going to hang himself was from when mother died 10 years ago.Reports he considers himself an anxious person in general, reports feeling palpitations and nervous sweats, racing thoughts. Reports he has panic attacks, feeling of pressure on his chest, reports it occurs 2-3x a week. He reports Ashwaganda helps. Feel like prozac helped with depression but no anxiety issues back then. Normally gets 6-8 hours of sleep. No issues with appetite, reports not  sleeping well due to getting vitals checked. Also wants nicotine patch   He denies SI/HI/AVH.   Asks about taking a shower. Discussed that patient is under IVC and recommendation from inpatient psychiatric hospitalization. Patient does not feel that going to inpatient psychiatric hospital is necessary for him but he agrees.   Psych ROS:  Depression: he denies any current depressive symptoms but does report he stated the suicidal ideation at [facility] prior to admission (confirmed with collateral) Anxiety:  reports anxiety symptoms, panic attacks.  Mania (lifetime and current): Denies any history consistent with manic episodes.  Psychosis: (lifetime and current): Denies AVH.   Collateral information:  10/17 at [facility] staff obtained collateral information from his wife via telephone and she reports " patient has ongoing suicidal ideations and tried to kill himself today with a box cutter and also stated he was going to hang himself". She also reports he experiences frequent seizure episodes at work and home, which he's not able to manage with gabapentin. He also consumes alcohol daily.    10/18 from [facility] Aunt, Zella Richer, called me back for collateral.  Ailene Ravel expressed her concerns about Stephen [name] medical insurance while dealing with his current medical and mental health state.  Fannie Knee is happy to be able to provide family support for Triston and had a phone conversation with him at 4am this morning.  Fannie Knee advised that Tamim had a SI yesterday and purposely kept this quiet from the ED staff to avoid being admitted for treatment.  Dimitris lost his mother (40yrs old) in 2017.  She added that Coulton's mother was previously diagnosed with Major Depressive Disorder (MDD) and Generalized Anxiety Disorder (GAD).  In addition, Jahleel's father was placed in a nursing facility since 2015 and will be residing there permanently due to functional decline.  Fannie Knee believes Hutton's father presented mental health  challenges for many years although he was in denial of his diagnoses.     Fannie Knee stated that Mathias's seizures have been ongoing for the past 6 to 8 months and affects his driving,causes memory loss, and cognitive distortions.  According to Ailene Ravel, Kieron has no alcohol issues and only has an occasional beer but does not drink liquor.  She added that Abraham lives with his uncle and grandmother who has dementia.  Zaylin's uncle is at least 42 years old with an onset of dementia and has a history of alcoholism.    Psychiatric History:  Information collected from chart review   Prev Dx/Sx: anxiety, depression, reports diagnosis of "manic" at 76 yo when parents divorced Current Psych Provider: denies. -Reports saw a psychiatrist at that time and got prozac  -Reports he wants to seek therapy, [email or web address]   Home Meds (current): taking a family member's gabapentin Previous Med Trials: prozac 20 10 years ago (stopped taking it), cymbalta 30, klonopin 0.5 TID PRN, percocet 5-325 PRN   PDMP: 3 day supply of librium  Therapy: reports went to [facility] 6 months-1 year ago.    Prior ECT: none Prior Psych Hospitalization: none   Prior Self Harm: reports suicide attempt after mom passed away with grief (8+ years ago), contemplated hanging himself.  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, theatre/television/film for [employer]  Legal Hx: denies  Living Situation: lives with 52 yo grandma with severe dementia and uncle  Spiritual Hx: denies  Access to weapons: denies   Substance History Tobacco use: nicotine (black and mild, 1 cigar per day reports  after mom died), also reports cannabis use (a bowl every day since high school) Alcohol use: no AA or detox, past history of alcohol abuse  Drug use: denies   Exam Findings   Psychiatric Specialty Exam:  Presentation  General Appearance: Casual  Eye Contact:Good  Speech:Clear and Coherent  Speech Volume:Normal  Handedness:Right   Mood and Affect  Mood:Anxious  Affect:Full Range   Thought Process  Thought Processes:Coherent  Descriptions of Associations:Intact  Orientation:Full (Time, Place and Person)  Thought Content:Logical  Hallucinations:Hallucinations: None  Ideas of Reference:None  Suicidal Thoughts:Suicidal Thoughts: No  Homicidal Thoughts:Homicidal Thoughts: No   Sensorium  Memory:Immediate Fair; Recent Fair; Remote Fair  Judgment:Fair  Insight:Present   Executive Functions  Concentration:Fair  Attention Span:Fair  Recall:Fair  Fund of Knowledge:Fair  Language:Fair   Psychomotor Activity  Psychomotor Activity:Psychomotor Activity: Normal   Assets  Assets:Communication Skills; Social Support; Desire for Improvement; Housing   Sleep  Sleep:Sleep: Fair    Physical Exam: Vital signs:  Temp:  [98.4 F (36.9 C)] 98.4 F (36.9 C) (10/21 1602) Pulse Rate:  [  88-106] 101 (10/21 1602) Resp:  [18] 18 (10/21 1602) BP: (116-125)/(79-93) 125/93 (10/21 1602) SpO2:  [96 %-99 %] 97 % (10/21 1602) Physical Exam Constitutional:      Appearance: Normal appearance.  HENT:     Head: Normocephalic and atraumatic.  Pulmonary:     Effort: Pulmonary effort is normal.  Skin:    General: Skin is warm and dry.  Neurological:     General: No focal deficit present.     Mental Status: He is alert.  Psychiatric:        Thought Content: Thought content does not include homicidal or suicidal ideation.    Blood pressure (!) 125/93, pulse (!) 101, temperature 98.4 F (36.9 C), resp. rate 18, height 5\' 9"  (1.753 m), weight 63.5 kg, SpO2 97%.  Body mass index is 20.67 kg/m.   Other History   These have been pulled in through the EMR, reviewed, and updated if appropriate.   Family History:  Family Psych History: mother MDD, GAD, reports she had a breakdown  -Father in nursing facility, residing there permanently due to functional decline -uncle with hx of alcoholism Family Hx suicide: denies  The patient's family history is not on file.  Medical History: Past Medical History:  Diagnosis Date   Depression    Seizures (HCC)     Surgical History: Past Surgical History:  Procedure Laterality Date   WISDOM TOOTH EXTRACTION      Medications:   Current Facility-Administered Medications:    enoxaparin (LOVENOX) injection 40 mg, 40 mg, Subcutaneous, Q24H, Alekh, Kshitiz, MD, 40 mg at 08/02/23 1022   [START ON 08/06/2023] FLUoxetine (PROZAC) capsule 10 mg, 10 mg, Oral, Daily, Karie Fetch, MD   folic acid (FOLVITE) tablet 1 mg, 1 mg, Oral, Daily, Alekh, Kshitiz, MD, 1 mg at 08/05/23 0839   ibuprofen (ADVIL) tablet 400 mg, 400 mg, Oral, Q6H PRN, Hanley Ben, Kshitiz, MD   [EXPIRED] LORazepam (ATIVAN) injection 0-4 mg, 0-4 mg, Intravenous, Q6H, 1 mg at 08/02/23 2100 **FOLLOWED BY** LORazepam (ATIVAN) injection 0-4 mg, 0-4 mg, Intravenous, Q12H, Alekh, Kshitiz, MD, 1 mg at 08/05/23 1239   metoprolol tartrate (LOPRESSOR) tablet 25 mg, 25 mg, Oral, BID, Elgergawy, Leana Roe, MD, 25 mg at 08/05/23 1617   multivitamin with minerals tablet 1 tablet, 1 tablet, Oral, Daily, Alekh, Kshitiz, MD, 1 tablet at 08/05/23 0839   ondansetron (ZOFRAN) tablet 4 mg, 4 mg, Oral, Q6H PRN **OR** ondansetron (ZOFRAN) injection 4 mg, 4 mg, Intravenous, Q6H PRN, Alekh, Kshitiz, MD   potassium chloride SA (KLOR-CON M) CR tablet 40 mEq, 40 mEq, Oral, Q6H, Elgergawy, Leana Roe, MD, 40 mEq at 08/05/23 1617   thiamine (VITAMIN B1) tablet 100 mg, 100 mg, Oral, Daily, 100 mg at 08/05/23 0839 **OR** thiamine (VITAMIN B1) injection 100 mg, 100 mg, Intravenous, Daily,  Alekh, Kshitiz, MD  Allergies: No Known Allergies

## 2023-08-06 ENCOUNTER — Encounter (HOSPITAL_COMMUNITY): Payer: Self-pay | Admitting: Psychiatry

## 2023-08-06 ENCOUNTER — Inpatient Hospital Stay (HOSPITAL_COMMUNITY)
Admission: AD | Admit: 2023-08-06 | Discharge: 2023-08-09 | DRG: 885 | Disposition: A | Discharge status: Home / Self Care | Payer: Medicaid Other | Source: Intra-hospital | Attending: Addiction Medicine | Admitting: Addiction Medicine

## 2023-08-06 ENCOUNTER — Other Ambulatory Visit: Payer: Self-pay

## 2023-08-06 DIAGNOSIS — F1210 Cannabis abuse, uncomplicated: Secondary | ICD-10-CM | POA: Diagnosis present

## 2023-08-06 DIAGNOSIS — F410 Panic disorder [episodic paroxysmal anxiety] without agoraphobia: Secondary | ICD-10-CM | POA: Diagnosis present

## 2023-08-06 DIAGNOSIS — F17290 Nicotine dependence, other tobacco product, uncomplicated: Secondary | ICD-10-CM | POA: Diagnosis present

## 2023-08-06 DIAGNOSIS — F332 Major depressive disorder, recurrent severe without psychotic features: Principal | ICD-10-CM | POA: Diagnosis present

## 2023-08-06 DIAGNOSIS — F411 Generalized anxiety disorder: Principal | ICD-10-CM | POA: Insufficient documentation

## 2023-08-06 DIAGNOSIS — I10 Essential (primary) hypertension: Secondary | ICD-10-CM | POA: Diagnosis present

## 2023-08-06 DIAGNOSIS — Z720 Tobacco use: Secondary | ICD-10-CM | POA: Insufficient documentation

## 2023-08-06 DIAGNOSIS — R45851 Suicidal ideations: Secondary | ICD-10-CM | POA: Diagnosis present

## 2023-08-06 DIAGNOSIS — F1010 Alcohol abuse, uncomplicated: Secondary | ICD-10-CM | POA: Insufficient documentation

## 2023-08-06 DIAGNOSIS — R569 Unspecified convulsions: Secondary | ICD-10-CM | POA: Diagnosis present

## 2023-08-06 MED ORDER — FLUOXETINE HCL 10 MG PO CAPS
10.0000 mg | ORAL_CAPSULE | Freq: Every day | ORAL | Status: DC
  Administered 2023-08-07: 10 mg via ORAL
  Filled 2023-08-06 (×2): qty 1

## 2023-08-06 MED ORDER — ADULT MULTIVITAMIN W/MINERALS CH
1.0000 | ORAL_TABLET | Freq: Every day | ORAL | Status: DC
  Administered 2023-08-07 – 2023-08-09 (×3): 1 via ORAL
  Filled 2023-08-06 (×5): qty 1

## 2023-08-06 MED ORDER — ALUM & MAG HYDROXIDE-SIMETH 200-200-20 MG/5ML PO SUSP: 30.0000 mL | ORAL | Status: DC | PRN

## 2023-08-06 MED ORDER — FOLIC ACID 1 MG PO TABS
1.0000 mg | ORAL_TABLET | Freq: Every day | ORAL | Status: DC
  Administered 2023-08-07 – 2023-08-09 (×3): 1 mg via ORAL
  Filled 2023-08-06 (×5): qty 1

## 2023-08-06 MED ORDER — POTASSIUM CHLORIDE 20 MEQ PO PACK
20.0000 meq | PACK | Freq: Every day | ORAL | Status: DC
  Administered 2023-08-07 – 2023-08-09 (×3): 20 meq via ORAL
  Filled 2023-08-06 (×5): qty 1

## 2023-08-06 MED ORDER — TRAZODONE HCL 50 MG PO TABS
50.0000 mg | ORAL_TABLET | Freq: Every evening | ORAL | Status: DC | PRN
  Administered 2023-08-06 – 2023-08-08 (×3): 50 mg via ORAL
  Filled 2023-08-06 (×3): qty 1

## 2023-08-06 MED ORDER — NICOTINE 21 MG/24HR TD PT24: 21.0000 mg | MEDICATED_PATCH | Freq: Every day | TRANSDERMAL | Status: DC

## 2023-08-06 MED ORDER — VITAMIN B-1 100 MG PO TABS
100.0000 mg | ORAL_TABLET | Freq: Every day | ORAL | Status: DC
  Filled 2023-08-06 (×2): qty 1

## 2023-08-06 MED ORDER — LOPERAMIDE HCL 2 MG PO CAPS: 2.0000 mg | ORAL_CAPSULE | ORAL | Status: DC | PRN

## 2023-08-06 MED ORDER — ACETAMINOPHEN 325 MG PO TABS: 650.0000 mg | ORAL_TABLET | Freq: Four times a day (QID) | ORAL | Status: DC | PRN

## 2023-08-06 MED ORDER — THIAMINE HCL 100 MG/ML IJ SOLN: 100.0000 mg | Freq: Every day | INTRAMUSCULAR | Status: DC

## 2023-08-06 MED ORDER — ADULT MULTIVITAMIN W/MINERALS CH: 1.0000 | ORAL_TABLET | Freq: Every day | ORAL | Status: DC

## 2023-08-06 MED ORDER — DIPHENHYDRAMINE HCL 25 MG PO CAPS: 50.0000 mg | ORAL_CAPSULE | Freq: Three times a day (TID) | ORAL | Status: DC | PRN

## 2023-08-06 MED ORDER — METOPROLOL TARTRATE 12.5 MG HALF TABLET
12.5000 mg | ORAL_TABLET | Freq: Two times a day (BID) | ORAL | Status: DC
  Administered 2023-08-06 – 2023-08-07 (×3): 12.5 mg via ORAL
  Filled 2023-08-06 (×6): qty 1

## 2023-08-06 MED ORDER — HYDROXYZINE HCL 25 MG PO TABS
25.0000 mg | ORAL_TABLET | Freq: Four times a day (QID) | ORAL | Status: DC | PRN
  Administered 2023-08-06 – 2023-08-07 (×2): 25 mg via ORAL

## 2023-08-06 MED ORDER — NICOTINE 14 MG/24HR TD PT24: 14.0000 mg | MEDICATED_PATCH | Freq: Every day | TRANSDERMAL | Status: DC

## 2023-08-06 MED ORDER — VITAMIN B-1 100 MG PO TABS: 100.0000 mg | ORAL_TABLET | Freq: Every day | ORAL | Status: DC

## 2023-08-06 MED ORDER — VITAMIN B-1 100 MG PO TABS
100.0000 mg | ORAL_TABLET | Freq: Every day | ORAL | Status: DC
  Administered 2023-08-07 – 2023-08-09 (×3): 100 mg via ORAL
  Filled 2023-08-06 (×5): qty 1

## 2023-08-06 MED ORDER — THIAMINE HCL 100 MG/ML IJ SOLN: 100.0000 mg | Freq: Once | INTRAMUSCULAR | Status: DC

## 2023-08-06 MED ORDER — FOLIC ACID 1 MG PO TABS: 1.0000 mg | ORAL_TABLET | Freq: Every day | ORAL | Status: DC

## 2023-08-06 MED ORDER — LORAZEPAM 1 MG PO TABS: 2.0000 mg | ORAL_TABLET | Freq: Three times a day (TID) | ORAL | Status: DC | PRN

## 2023-08-06 MED ORDER — ONDANSETRON HCL 4 MG PO TABS: 4.0000 mg | ORAL_TABLET | Freq: Four times a day (QID) | ORAL | Status: DC | PRN

## 2023-08-06 MED ORDER — FLUOXETINE HCL 10 MG PO CAPS: 10.0000 mg | ORAL_CAPSULE | Freq: Every day | ORAL | Status: DC

## 2023-08-06 MED ORDER — METOPROLOL TARTRATE 25 MG PO TABS: 12.5000 mg | ORAL_TABLET | Freq: Two times a day (BID) | ORAL | Status: DC

## 2023-08-06 MED ORDER — DIPHENHYDRAMINE HCL 50 MG/ML IJ SOLN: 50.0000 mg | Freq: Three times a day (TID) | INTRAMUSCULAR | Status: DC | PRN

## 2023-08-06 MED ORDER — MAGNESIUM HYDROXIDE 400 MG/5ML PO SUSP: 30.0000 mL | Freq: Every day | ORAL | Status: DC | PRN

## 2023-08-06 MED ORDER — HALOPERIDOL LACTATE 5 MG/ML IJ SOLN: 5.0000 mg | Freq: Three times a day (TID) | INTRAMUSCULAR | Status: DC | PRN

## 2023-08-06 MED ORDER — NICOTINE 14 MG/24HR TD PT24
14.0000 mg | MEDICATED_PATCH | Freq: Every day | TRANSDERMAL | Status: DC
  Administered 2023-08-07 – 2023-08-09 (×3): 14 mg via TRANSDERMAL
  Filled 2023-08-06 (×5): qty 1

## 2023-08-06 MED ORDER — LORAZEPAM 1 MG PO TABS: 1.0000 mg | ORAL_TABLET | Freq: Four times a day (QID) | ORAL | Status: DC | PRN

## 2023-08-06 MED ORDER — HYDROXYZINE HCL 25 MG PO TABS
25.0000 mg | ORAL_TABLET | Freq: Three times a day (TID) | ORAL | Status: DC | PRN
Start: 2023-08-06 — End: 2023-08-09
  Administered 2023-08-06 – 2023-08-09 (×4): 25 mg via ORAL
  Filled 2023-08-06 (×6): qty 1

## 2023-08-06 MED ORDER — LORAZEPAM 2 MG/ML IJ SOLN: 2.0000 mg | Freq: Three times a day (TID) | INTRAMUSCULAR | Status: DC | PRN

## 2023-08-06 MED ORDER — ONDANSETRON 4 MG PO TBDP: 4.0000 mg | ORAL_TABLET | Freq: Four times a day (QID) | ORAL | Status: DC | PRN

## 2023-08-06 MED ORDER — ALPRAZOLAM 0.5 MG PO TABS
0.5000 mg | ORAL_TABLET | Freq: Once | ORAL | Status: AC
  Administered 2023-08-06: 0.5 mg via ORAL
  Filled 2023-08-06: qty 1

## 2023-08-06 MED ORDER — HALOPERIDOL 5 MG PO TABS
5.0000 mg | ORAL_TABLET | Freq: Three times a day (TID) | ORAL | Status: DC | PRN
Start: 2023-08-06 — End: 2023-08-09

## 2023-08-06 NOTE — Plan of Care (Signed)
  Problem: Education: Goal: Knowledge of [facility] Education information/materials will improve Outcome: Progressing Goal: Emotional status will improve Outcome: Progressing Goal: Mental status will improve Outcome: Progressing   

## 2023-08-06 NOTE — Consult Note (Signed)
Stephen [name] Psychiatry Consult Evaluation  Service Date: August 06, 2023 LOS:  LOS: 4 days   Primary Psychiatric Diagnoses  MDD 2.  GAD  Assessment  Stephen [name] is a 32 y.o. male admitted medically for 08/01/2023 11:35 PM for seizure-like activity. He carries the psychiatric diagnoses of MDD and has a past medical history of  seizures. Psychiatry was consulted for "suicidal ideation, IVCed, he is medically cleared to go to [facility]" by Elgergawy, Leana Roe, MD.  His current presentation of suicidal ideation and reported not discussing suicidal ideation due to not wanting to be admitted is most consistent with suspected MDD. He also endorses many symptoms consistent with GAD including his ongoing anxiety, feeling on edge, increased muscle tension and he has occasional panic attacks. He reports that he gets palpitations, nervous sweats, feeling of pressure on his chest. Current outpatient psychotropic medications include none and historically he has had a good response to taking prozac in the past. On initial examination, patient not reporting depressive symptoms though he does admit to having the suicidal ideations which he reports occurred in a moment where he felt very stressed. He was under IVC per information from collateral and we will continue IVC at this time and recommend inpatient psychiatric hospitalization. Patient has been accepted to [facility] today. Started prozac this AM, he denies any side effects. Will continue with [facility] admission. Please see plan below for detailed recommendations.   Diagnoses:  Active Hospital problems: Principal Problem:   MDD (major depressive disorder), recurrent severe, without psychosis (HCC) Active Problems:   Seizure-like activity (HCC)   Suicidal ideation     Plan   ## Psychiatric Medication Recommendations:  -- continue prozac 10mg  daily for depression and anxiety  -- continue nicotine patch for tobacco use disorder   ## Medical Decision Making  Capacity:  -- not formally assessed   ## Further Work-up:  -- per primary  -- most recent EKG on 10/21 had QtC of 442 -- Pertinent labwork reviewed earlier this admission includes: CK 1610>9604, CBC wnl, AST 203>389, ALT 232>312, TSH wnl, UDS+THC, ethanol 248, CT negative   ## Disposition:  -- We recommend inpatient psychiatric hospitalization after medical hospitalization. Patient has been involuntarily committed on 10/18.   ## Behavioral / Environmental:  --   No specific recommendations at this time.   ## Safety and Observation Level:  - Based on my clinical evaluation, I estimate the patient to be at high risk of self harm in the current setting - At this time, we recommend a 1:1 level of observation. This decision is based on my review of the chart including patient's history and current presentation, interview of the patient, mental status examination, and consideration of suicide risk including evaluating suicidal ideation, plan, intent, suicidal or self-harm behaviors, risk factors, and protective factors. This judgment is based on our ability to directly address suicide risk, implement suicide prevention strategies and develop a safety plan while the patient is in the clinical setting. Please contact our team if there is a concern that risk level has changed.  Suicide risk assessment  Patient has following modifiable risk factors for suicide: untreated depression and lack of access to outpatient mental health resources, which we are addressing by starting prozac, getting him to the psychiatric hospital.   Patient has following non-modifiable or demographic risk factors for suicide: male gender  Patient has the following protective factors against suicide: Supportive family, Supportive friends, and no history of NSSIB  Thank you for this consult  request. Recommendations have been communicated to the primary team.  We will sign off at this time.   Karie Fetch, MD,  PGY-2  Psychiatric and Social History   Relevant Aspects of Hospital Course:  Admitted on 08/01/2023 for seizures.  -Neurology consult, suspicious for PNES, LTM EEG wnl, advise no epileptics   Chart review:  VSS.   Patient Report:  Patient is seen lying in bed. He reports he is shaky, feeling anxious regarding going to inpatient psychiatric hospital. He asks about admission, reports that we are in the process of getting him to inpatient psychiatric hospital. He denies any side effects from prozac, reports that he never had any side effects from it previously. He reports to this provider that he was taking up to 40-60mg  15 years ago but that it stopped working. He reports that yesterday was his first good night of sleep because they were not doing vital checks. He reports no issues with appetite. He reports his mood is "ready for the world." He also reports that he is anxious. He denies SI/HI/AVH. He reports that he has been enjoying his time with the sitters.   Psych ROS:  Depression: he denies any current depressive symptoms but does report he stated the suicidal ideation at [facility] prior to admission (confirmed with collateral) Anxiety:  reports anxiety symptoms, panic attacks.  Mania (lifetime and current): Denies any history consistent with manic episodes.  Psychosis: (lifetime and current): Denies AVH.   Collateral information:  10/17 at [facility] staff obtained collateral information from his wife via telephone and she reports " patient has ongoing suicidal ideations and tried to kill himself today with a box cutter and also stated he was going to hang himself". She also reports he experiences frequent seizure episodes at work and home, which he's not able to manage with gabapentin. He also consumes alcohol daily.    10/18 from [facility] Aunt, Stephen [name], called me back for collateral.  Stephen [name] expressed her concerns about Rena not having medical insurance while dealing with his  current medical and mental health state.  Stephen [name] is happy to be able to provide family support for [place] and had a phone conversation with him at 4am this morning.  Stephen [name] advised that Isahi had a SI yesterday and purposely kept this quiet from the ED staff to avoid being admitted for treatment.  Seamon lost his mother (48yrs old) in 2017.  She added that Dariyon's mother was previously diagnosed with Major Depressive Disorder (MDD) and Generalized Anxiety Disorder (GAD).  In addition, Marce's father was placed in a nursing facility since 2015 and will be residing there permanently due to functional decline.  Stephen [name] believes Denzell's father presented mental health challenges for many years although he was in denial of his diagnoses.     Stephen [name] stated that Johney's seizures have been ongoing for the past 6 to 8 months and affects his driving,causes memory loss, and cognitive distortions.  According to Stephen [name], Joslyn has no alcohol issues and only has an occasional beer but does not drink liquor.  She added that Lord lives with his uncle and grandmother who has dementia.  Dayson's uncle is at least 3 years old with an onset of dementia and has a history of alcoholism.    Psychiatric History:  Information collected from chart review   Prev Dx/Sx: anxiety, depression, reports diagnosis of "manic" at 37 yo when parents divorced Current Psych Provider: denies. -Reports saw a psychiatrist at that time  and got prozac  -Reports he wants to seek therapy, [email or web address]   Home Meds (current): taking a family member's gabapentin Previous Med Trials: prozac 20 10 years ago (stopped taking it), cymbalta 30, klonopin 0.5 TID PRN, percocet 5-325 PRN   PDMP: 3 day supply of librium  Therapy: reports went to [facility] 6 months-1 year ago.    Prior ECT: none Prior Psych Hospitalization: none   Prior Self Harm: reports suicide attempt after mom passed away with grief (8+ years ago), contemplated  hanging himself.  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, theatre/television/film for [employer]  Legal Hx: denies  Living Situation: lives with 11 yo grandma with severe dementia and uncle  Spiritual Hx: denies  Access to weapons: denies   Substance History Tobacco use: nicotine (black and mild, 1 cigar per day reports after mom died), also reports cannabis use (a bowl every day since high school) Alcohol use: no AA or detox, past history of alcohol abuse  Drug use: denies   Exam Findings   Psychiatric Specialty Exam:  Presentation  General Appearance: Appropriate for Environment  Eye Contact:Good  Speech:Clear and Coherent  Speech Volume:Normal  Handedness:Right   Mood and Affect  Mood:Anxious  Affect:Congruent   Thought Process  Thought Processes:Coherent  Descriptions of Associations:Intact  Orientation:Full (Time, Place and Person)  Thought Content:Logical  Hallucinations:Hallucinations: None  Ideas of Reference:None  Suicidal Thoughts:Suicidal Thoughts: No  Homicidal Thoughts:Homicidal Thoughts: No   Sensorium  Memory:Immediate Fair; Recent Fair; Remote Fair  Judgment:Fair  Insight:Present   Executive Functions  Concentration:Fair  Attention Span:Fair  Recall:Fair  Fund of Knowledge:Fair  Language:Fair   Psychomotor Activity  Psychomotor Activity:Psychomotor Activity: Normal   Assets  Assets:Communication Skills; Social Support; Desire for Improvement; Housing   Sleep  Sleep:Sleep: Good    Physical Exam: Vital signs:  Temp:  [98 F (36.7 C)-99 F (37.2 C)] 98.2 F (36.8 C) (10/22 0753) Pulse Rate:   [96-107] 107 (10/22 0753) Resp:  [16-19] 19 (10/22 0753) BP: (106-127)/(63-93) 106/92 (10/22 0753) SpO2:  [97 %-100 %] 100 % (10/22 0753) Physical Exam Constitutional:      Appearance: Normal appearance.  HENT:     Head: Normocephalic and atraumatic.  Pulmonary:     Effort: Pulmonary effort is normal.  Skin:    General: Skin is warm and dry.  Neurological:     General: No focal deficit present.     Mental Status: He is alert.  Psychiatric:        Thought Content: Thought content does not include homicidal or suicidal ideation.    Blood pressure (!) 106/92, pulse (!) 107, temperature 98.2 F (36.8 C), temperature source Oral, resp. rate 19, height 5\' 9"  (1.753 m), weight 63.5 kg, SpO2 100%. Body mass index is 20.67 kg/m.   Other History   These have been pulled in through the EMR, reviewed, and updated if appropriate.   Family History:  Family Psych History: mother MDD, GAD, reports she had a breakdown  -Father in nursing facility, residing there permanently due to functional decline -uncle with hx of alcoholism Family Hx suicide: denies  The patient's family history is not on file.  Medical  History: Past Medical History:  Diagnosis Date   Depression    Seizures (HCC)     Surgical History: Past Surgical History:  Procedure Laterality Date   WISDOM TOOTH EXTRACTION      Medications:   Current Facility-Administered Medications:    enoxaparin (LOVENOX) injection 40 mg, 40 mg, Subcutaneous, Q24H, Alekh, Kshitiz, MD, 40 mg at 08/02/23 1022   FLUoxetine (PROZAC) capsule 10 mg, 10 mg, Oral, Daily, Karie Fetch, MD, 10 mg at 08/06/23 0865   folic acid (FOLVITE) tablet 1 mg, 1 mg, Oral, Daily, Alekh, Kshitiz, MD, 1 mg at 08/06/23 0841   ibuprofen (ADVIL) tablet 400 mg, 400 mg, Oral, Q6H PRN, Hanley Ben, Kshitiz, MD   metoprolol tartrate (LOPRESSOR) tablet 25 mg, 25 mg, Oral, BID, Elgergawy, Leana Roe, MD, 25 mg at 08/06/23 0841   multivitamin with minerals tablet 1 tablet,  1 tablet, Oral, Daily, Hanley Ben, Kshitiz, MD, 1 tablet at 08/06/23 0841   nicotine (NICODERM CQ - dosed in mg/24 hours) patch 14 mg, 14 mg, Transdermal, Daily, Opyd, Lavone Neri, MD, 14 mg at 08/06/23 0841   ondansetron (ZOFRAN) tablet 4 mg, 4 mg, Oral, Q6H PRN **OR** ondansetron (ZOFRAN) injection 4 mg, 4 mg, Intravenous, Q6H PRN, Hanley Ben, Kshitiz, MD   thiamine (VITAMIN B1) tablet 100 mg, 100 mg, Oral, Daily, 100 mg at 08/06/23 0841 **OR** thiamine (VITAMIN B1) injection 100 mg, 100 mg, Intravenous, Daily, Alekh, Kshitiz, MD  Allergies: No Known Allergies

## 2023-08-06 NOTE — Tx Team (Signed)
Initial Treatment Plan 08/06/2023 7:25 PM Stephen [name] XNA:[id number]    PATIENT STRESSORS: Health problems   Substance abuse   Traumatic event     PATIENT STRENGTHS: Capable of independent living  Communication skills  Motivation for treatment/growth  Supportive family/friends  Work skills    PATIENT IDENTIFIED PROBLEMS: Suicidal ideation    Anxiety/panic attacks     Seizure-like activity    Work stress         DISCHARGE CRITERIA:  Adequate post-discharge living arrangements Improved stabilization in mood, thinking, and/or behavior Reduction of life-threatening or endangering symptoms to within safe limits Verbal commitment to aftercare and medication compliance Withdrawal symptoms are absent or subacute and managed without 24-hour nursing intervention  PRELIMINARY DISCHARGE PLAN: Attend 12-step recovery group Outpatient therapy Return to previous living arrangement Return to previous work or school arrangements  PATIENT/FAMILY INVOLVEMENT: This treatment plan has been presented to and reviewed with the patient, Stephen [name], .  The patient has been given the opportunity to ask questions and make suggestions.  Shela Nevin, RN 08/06/2023, 7:25 PM

## 2023-08-06 NOTE — Progress Notes (Signed)
Patient is a 32 year old male who presented from [facility] under IVC due to making suicidal statements to his fiance. Pt stated that these statements were made "out of passion", and he regrets making them. 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 Pt currently lives with his uncle and 17 year old grandmother, and described himself as his grandmother's caretaker. Pt's main support is his fiance, and his aunt, who is currently in the hospital having surgery. Pt reported that he is an only child, and lost his mother 8 years ago. Pt became tearful when describing the circumstances surrounding his mother's death, and having been the one having found her deceased. Pt endorses panic attacks, crying spells and worrying.Pt denied pain, withdrawal symptoms, SI/HI and A/VH. Pt reported drinking alcohol every day to every other day, and endorsed smoking weed before bedtime to help him get to sleep.  Pt presented very anxious, fidgety, but friendly and polite. Pt answered questions logically and coherently throughout admission interview. Admission paperwork completed and signed. Verbal understanding expressed. VS monitored and recorded. Skin check performed with MHT and revealed "flea bite" bumps on ankles and wrists. (Pt reported these were from his 3 cats). Belongings searched and secured in locker. Patient was oriented to unit and schedule. Fall precautions and Q 15 min checks initiated for safety. Marland Kitchen

## 2023-08-06 NOTE — Discharge Summary (Signed)
Physician Discharge Summary  GARWIN STADELMAN UEA:[id number] DOB: 08-08-1991 DOA: 08/01/2023  PCP: Patient, No Pcp Per  Admit date: 08/01/2023 Discharge date: 08/06/2023  Admitted From: (Home) Disposition:  [facility])  Recommendations for Outpatient Follow-up:  Management per [facility]  Diet recommendation:  Regular  Brief/Interim Summary:  Stephen [name] is a 32 y.o. male with medical history significant of depression, seizures was brought from behavioral health urgent care with concern for seizures.  he had 4 episodes of seizure-like episodes  that he described like "spacing out, muscle stiffening, tensing up and shaking all over".  These episodes lasted for 10 to 15 minutes at a time apparently.  He has been on gabapentin for seizure disorder but has not seen a neurologist.    Denied any tongue bite or incontinence or head trauma.  No fever, vomiting, chest pain, shortness of breath, abdominal pain, diarrhea or dysuria. He was seen in behavioral health urgent care with concern for suicidal ideation and cutting himself with a box cutter and recommendations was made for inpatient psychiatric admission.  Patient was sent to [facility], ED for evaluation regarding his seizures, as well he was IVC'd.  Ethanol level was 248.  CK of 4639.  CT head without contrast was negative for any acute intracranial abnormality.  ED provider discussed with on-call neurologist who recommended loading with Keppra and transferring to [facility] for continuous EEG.  He was also started on CIWA protocol.  Hospitalist service was called to evaluate the patient.  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   Seizure-like activity -Neurology input greatly appreciated, he remains on LTM EEG -So far continue to hold on  initiating antiepileptics. 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     Alcohol abuse, with possible withdrawals -Concern for alcohol withdrawal, he was kept on CIWA protocol, multivitamins, thiamine and folic acid.   Sinus tachycardia -This is most likely in the setting of alcohol withdrawals, TSH within normal limit, received IV hydration, as well he was started on low-dose metoprolol which should assist with his alcohol withdrawals as well, this has much improved, he is to be continued on low-dose metoprolol 12.5 mg p.o. twice daily at time of discharge.    Suicidal ideation Depression -Presented to behavioral health urgent care with suicidal ideation and recommendations was for inpatient psychiatric hospitalization.  -Psychiatry input greatly appreciated, patient was IVC did, he was kept on one-to-one sitter during hospital stay, bed available to [facility] discharge today, he was started on Prozac.  Elevated LFTs -Secondary to alcohol abuse, trending down.   Hyponatremia Due to dehydration, resolved   Acute metabolic acidosis Dehydration, resolved   Leukocytosis -Possibly reactive.  Resolved   Elevated CK -Treated with IV fluids   Hypokalemia -Repleted   Marijuana use -Urine drug screen positive for tetrahydrocannabinol.  TOC consuled    Discharge Diagnoses:  Principal Problem:   MDD (major depressive disorder), recurrent severe, without psychosis (HCC) Active Problems:   Seizure-like activity (HCC)   Suicidal ideation    Discharge Instructions  Discharge Instructions     Diet - low sodium heart healthy   Complete by: As directed    Discharge  instructions   Complete by: As directed    Management per [facility]   Increase activity slowly   Complete by: As directed       Allergies as  of 08/06/2023   No Known Allergies      Medication List     STOP taking these medications    chlordiazePOXIDE 25 MG capsule Commonly known as: LIBRIUM   dicyclomine 20 MG tablet Commonly known as: BENTYL       TAKE these medications    FLUoxetine 10 MG capsule Commonly known as: PROZAC Take 1 capsule (10 mg total) by mouth daily. Start taking on: August 07, 2023   folic acid 1 MG tablet Commonly known as: FOLVITE Take 1 tablet (1 mg total) by mouth daily. Start taking on: August 07, 2023   metoprolol tartrate 25 MG tablet Commonly known as: LOPRESSOR Take 0.5 tablets (12.5 mg total) by mouth 2 (two) times daily.   multivitamin with minerals Tabs tablet Take 1 tablet by mouth daily. Start taking on: August 07, 2023   nicotine 14 mg/24hr patch Commonly known as: NICODERM CQ - dosed in mg/24 hours Place 1 patch (14 mg total) onto the skin daily. Start taking on: August 07, 2023   ondansetron 4 MG tablet Commonly known as: ZOFRAN Take 1 tablet (4 mg total) by mouth every 6 (six) hours as needed for nausea.   potassium chloride 10 MEQ tablet Commonly known as: KLOR-CON Take 2 tablets (20 mEq total) by mouth daily for 7 days.   thiamine 100 MG tablet Commonly known as: Vitamin B-1 Take 1 tablet (100 mg total) by mouth daily. Start taking on: August 07, 2023        No Known Allergies  Consultations: Psychiatry Neurology   Procedures/Studies: Overnight EEG with video  Result Date: 08/03/2023 Charlsie Quest, MD     08/04/2023  7:46 AM Patient Name: Stephen [name] MRN: [id number] Epilepsy Attending: Charlsie Quest Referring Physician/Provider: Milon Dikes, MD Duration: 08/02/2023 2215 to 08/03/2023 2215 Patient history: 32 y.o. male significant history of depression and suicidal ideation, and self-reported history of seizures that started 2 years ago in the setting of stress admitted for evaluation of multiple seizure episodes over the past few  days. EEG to evaluate for seizure Level of alertness: Awake, asleep AEDs during EEG study: LEV Technical aspects: This EEG study was done with scalp electrodes positioned according to the 10-20 International system of electrode placement. Electrical activity was reviewed with band pass filter of 1-70Hz , sensitivity of 7 uV/mm, display speed of 67mm/sec with a 60Hz  notched filter applied as appropriate. EEG data were recorded continuously and digitally stored.  Video monitoring was available and reviewed as appropriate. Description: The posterior dominant rhythm consists of 9-10 Hz activity of moderate voltage (25-35 uV) seen predominantly in posterior head regions, symmetric and reactive to eye opening and eye closing. Sleep was characterized by vertex waves, sleep spindles (12 to 14 Hz), maximal frontocentral region. Hyperventilation and photic stimulation were not performed.   IMPRESSION: This study is within normal limits. No seizures or epileptiform discharges were seen throughout the recording. A normal interictal EEG does not exclude the diagnosis of epilepsy. Charlsie Quest   CT Head Wo Contrast  Result Date: 08/02/2023 CLINICAL DATA:  Seizure.  Headache EXAM: CT HEAD WITHOUT CONTRAST TECHNIQUE: Contiguous axial images were obtained from the base of the skull through the vertex without intravenous contrast. RADIATION DOSE REDUCTION: This exam was performed according to the departmental dose-optimization  program which includes automated exposure control, adjustment of the mA and/or kV according to patient size and/or use of iterative reconstruction technique. COMPARISON:  10/28/2022 FINDINGS: Brain: No acute intracranial abnormality. Specifically, no hemorrhage, hydrocephalus, mass lesion, acute infarction, or significant intracranial injury. Vascular: No hyperdense vessel or unexpected calcification. Skull: No acute calvarial abnormality. Sinuses/Orbits: No acute findings Other: None IMPRESSION: Normal  study. Electronically Signed   By: Charlett Nose M.D.   On: 08/02/2023 02:47      Subjective:  No significant events overnight as discussed with staff, this morning patient reports some anxiety. Discharge Exam: Vitals:   08/06/23 0400 08/06/23 0753  BP: 124/66 (!) 106/92  Pulse: 98 (!) 107  Resp: 17 19  Temp: 98.7 F (37.1 C) 98.2 F (36.8 C)  SpO2: 98% 100%   Vitals:   08/05/23 2036 08/06/23 0000 08/06/23 0400 08/06/23 0753  BP: 114/63 127/67 124/66 (!) 106/92  Pulse: 96 96 98 (!) 107  Resp: 16 16 17 19   Temp: 99 F (37.2 C) 98 F (36.7 C) 98.7 F (37.1 C) 98.2 F (36.8 C)  TempSrc:  Oral Oral Oral  SpO2: 97% 97% 98% 100%  Weight:      Height:        General: Pt is alert, awake, not in acute distress ,appears anxious Cardiovascular: RRR, S1/S2 +, no rubs, no gallops Respiratory: CTA bilaterally, no wheezing, no rhonchi Abdominal: Soft, NT, ND, bowel sounds + Extremities: no edema, no cyanosis    The results of significant diagnostics from this hospitalization (including imaging, microbiology, ancillary and laboratory) are listed below for reference.     Microbiology: No results found for this or any previous visit (from the past 240 hour(s)).   Labs: BNP (last 3 results) No results for input(s): "BNP" in the last 8760 hours. Basic Metabolic Panel: Recent Labs  Lab 08/02/23 0038 08/02/23 0400 08/03/23 0401 08/04/23 0607 08/05/23 0946  NA 133* 132* 137 138 136  K 3.8 3.7 3.4* 4.0 3.4*  CL 93* 93* 94* 96* 95*  CO2 18* 21* 27 29 27   GLUCOSE 74 105* 82 103* 112*  BUN 11 11 5* 7 9  CREATININE 0.71 0.78 0.69 0.79 0.78  CALCIUM 9.4 8.6* 9.4 10.2 9.7  MG 2.1  --  1.8  --  1.8   Liver Function Tests: Recent Labs  Lab 08/02/23 0038 08/02/23 0400 08/03/23 0401 08/04/23 0607 08/05/23 0946  AST 356* 312* 673* 389* 203*  ALT 186* 161* 344* 312* 232*  ALKPHOS 112 94 102 126 113  BILITOT 0.9 0.5 1.5* 0.8 0.8  PROT 7.9 6.3* 6.7 7.4 7.0  ALBUMIN 4.6 3.7  3.8 4.1 3.8   No results for input(s): "LIPASE", "AMYLASE" in the last 168 hours. No results for input(s): "AMMONIA" in the last 168 hours. CBC: Recent Labs  Lab 08/02/23 0038 08/03/23 0401 08/04/23 0607 08/05/23 0946  WBC 14.9* 7.5 6.9 7.5  HGB 16.4 14.7 16.0 15.9  HCT 46.0 42.4 47.3 46.3  MCV 93.9 93.4 95.2 96.1  PLT 388 335 286 293   Cardiac Enzymes: Recent Labs  Lab 08/02/23 0038 08/03/23 0401  CKTOTAL 4,639* 4,594*   BNP: Invalid input(s): "POCBNP" CBG: No results for input(s): "GLUCAP" in the last 168 hours. D-Dimer No results for input(s): "DDIMER" in the last 72 hours. Hgb A1c No results for input(s): "HGBA1C" in the last 72 hours. Lipid Profile No results for input(s): "CHOL", "HDL", "LDLCALC", "TRIG", "CHOLHDL", "LDLDIRECT" in the last 72 hours. Thyroid function studies Recent  Labs    08/05/23 0946  TSH 2.803   Anemia work up No results for input(s): "VITAMINB12", "FOLATE", "FERRITIN", "TIBC", "IRON", "RETICCTPCT" in the last 72 hours. Urinalysis    Component Value Date/Time   COLORURINE AMBER (A) 10/28/2022 1642   APPEARANCEUR HAZY (A) 10/28/2022 1642   LABSPEC 1.027 10/28/2022 1642   PHURINE 7.0 10/28/2022 1642   GLUCOSEU NEGATIVE 10/28/2022 1642   HGBUR NEGATIVE 10/28/2022 1642   BILIRUBINUR MODERATE (A) 10/28/2022 1642   KETONESUR 80 (A) 10/28/2022 1642   PROTEINUR 100 (A) 10/28/2022 1642   UROBILINOGEN 0.2 11/13/2011 0402   NITRITE NEGATIVE 10/28/2022 1642   LEUKOCYTESUR NEGATIVE 10/28/2022 1642   Sepsis Labs Recent Labs  Lab 08/02/23 0038 08/03/23 0401 08/04/23 0607 08/05/23 0946  WBC 14.9* 7.5 6.9 7.5   Microbiology No results found for this or any previous visit (from the past 240 hour(s)).   Time coordinating discharge: Over 30 minutes  SIGNED:   Huey Bienenstock, MD  [facility] 08/06/2023, 10:14 AM Pager   If 7PM-7AM, please contact night-coverage [email or web address] Password [email or web address]

## 2023-08-06 NOTE — TOC Transition Note (Signed)
Transition of Care [facility]) - CM/SW Discharge Note   Patient Details  Name: Stephen [name] MRN: [id number] Date of Birth: [birth year]  Transition of [facility]) CM/SW Contact:  Mearl Latin, LCSW Phone Number: 08/06/2023, 1:47 PM   Clinical Narrative:    [facility] Anticipated DC date: 08/06/23 Family notified: Pt notifying Transport by: [employer]   Per MD patient ready for DC to [facility]. RN to call report prior to discharge [phone] bed [phone]). RN, patient, patient's family, and facility notified of DC. Discharge Summary sent to facility. IVC on chart. Transport requested for patient.   CSW will sign off for now as social work intervention is no longer needed. Please consult [place] again if new needs arise.     Final next level of care: [facility] Barriers to Discharge: Barriers Resolved   Patient Goals and CMS Choice      Discharge Placement                  Patient to be transferred to facility by: [employer]   Patient and family notified of of transfer: 08/06/23  Discharge Plan and Services Additional resources added to the After Visit Summary for   In-house Referral: Clinical Social Work                                   Social Determinants of Health (SDOH) Interventions SDOH Screenings   Food Insecurity: No Food Insecurity (08/02/2023)  Housing: Low Risk  (08/02/2023)  Transportation Needs: No Transportation Needs (08/02/2023)  Utilities: Not At Risk (08/02/2023)  Tobacco Use: High Risk (08/02/2023)     Readmission Risk Interventions     No data to display

## 2023-08-06 NOTE — [facility] Group Notes (Signed)
[facility] Group Notes:  (Nursing/MHT/Case Management/Adjunct)  Date:  08/06/2023  Time:  9:44 PM  Type of Therapy:  Psychoeducational Skills  Participation Level:  Active  Participation Quality:  Appropriate  Affect:  Appropriate  Cognitive:  Appropriate  Insight:  Improving  Engagement in Group:  Developing/Improving  Modes of Intervention:  Education  Summary of Progress/Problems: The patient rated his day as an 8 or 9 out of 10. He states that his hospital is in the hospital and is doing well. His goal for tomorrow is to go outside and read.   Hazle Coca S 08/06/2023, 9:44 PM

## 2023-08-06 NOTE — Plan of Care (Signed)

## 2023-08-07 ENCOUNTER — Encounter (HOSPITAL_COMMUNITY): Payer: Self-pay

## 2023-08-07 DIAGNOSIS — F411 Generalized anxiety disorder: Principal | ICD-10-CM | POA: Insufficient documentation

## 2023-08-07 DIAGNOSIS — F1210 Cannabis abuse, uncomplicated: Secondary | ICD-10-CM | POA: Insufficient documentation

## 2023-08-07 DIAGNOSIS — F332 Major depressive disorder, recurrent severe without psychotic features: Secondary | ICD-10-CM | POA: Diagnosis not present

## 2023-08-07 DIAGNOSIS — Z720 Tobacco use: Secondary | ICD-10-CM | POA: Insufficient documentation

## 2023-08-07 DIAGNOSIS — F1010 Alcohol abuse, uncomplicated: Secondary | ICD-10-CM | POA: Insufficient documentation

## 2023-08-07 MED ORDER — FLUOXETINE HCL 20 MG PO CAPS
20.0000 mg | ORAL_CAPSULE | Freq: Every day | ORAL | Status: DC
  Administered 2023-08-08 – 2023-08-09 (×2): 20 mg via ORAL
  Filled 2023-08-07 (×4): qty 1

## 2023-08-07 MED ORDER — LORAZEPAM 1 MG PO TABS
1.0000 mg | ORAL_TABLET | ORAL | Status: AC
  Administered 2023-08-07: 1 mg via ORAL
  Filled 2023-08-07: qty 1

## 2023-08-07 MED ORDER — FLUOXETINE HCL 10 MG PO CAPS
10.0000 mg | ORAL_CAPSULE | ORAL | Status: AC
  Administered 2023-08-07: 10 mg via ORAL
  Filled 2023-08-07: qty 1

## 2023-08-07 MED ORDER — METOPROLOL TARTRATE 25 MG PO TABS
25.0000 mg | ORAL_TABLET | Freq: Two times a day (BID) | ORAL | Status: DC
  Administered 2023-08-08 – 2023-08-09 (×3): 25 mg via ORAL
  Filled 2023-08-07 (×7): qty 1

## 2023-08-07 NOTE — H&P (Signed)
Psychiatric Admission Assessment Adult  Patient Identification: Stephen [name] MRN:  [id number] Date of Evaluation:  08/07/2023 Chief Complaint:  Major depressive disorder, recurrent episode, severe (HCC) [F33.2] Principal Diagnosis: GAD (generalized anxiety disorder) Diagnosis:  Principal Problem:   GAD (generalized anxiety disorder) Active Problems:   Seizure-like activity (HCC)   Cannabis abuse   Tobacco abuse   Alcohol abuse  History of Present Illness:  Patient is a 32 year old male with a reported psychiatric history of major depressive disorder and manic depression, who was admitted to the psychiatric hospital from the medical hospital for worsening anxiety, panic attacks, and suicidal thoughts.  Patient was initially admitted to the medical hospital for seizure-like activity.  Prior to admission outpatient psychiatric medications: None.  Patient reports not having taken Prozac for at least 15 years  On my assessment today, the patient reports that for the last few weeks he felt like he was having a "mental break".  He reports feeling lost, having crying spells, having extreme emotional lability including very severe anxiety.  He reports his fiance was concerned for his safety after he communicated with her that he was had thoughts to hurt himself.  Patient reports that he was having thoughts to cut himself.  He reports psychiatric distress has been very severe for about a few weeks, but overall has been bothersome for a few months.  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  Patient reports that his mood is not depressed, sad, and he denies anhedonia.  Reports no changes in sleep or appetite.  Reports energy level is okay.  Reports  concentration changes that accompany seizure-like activity.  At this time he reports less suicidal thoughts were prior to admission, as noted above.  Denies any HI.  He reports very significant and severe anxiety especially over the last few weeks.  Reports that overall anxiety has been elevated and generalized for months.  He reports having panic attacks every few days, with symptoms of tachycardia, tachypnea, chest pain, feeling of doom, severe anxiety, that last anywhere from a few minutes to an hour.  He reports drinking alcohol and smoking marijuana to self-medicate his anxiety and panic attacks.  Otherwise the patient reports a history of mood swings many years ago, but not meeting criteria for manic or hypomanic episode in the past -negating an actual diagnosis of bipolar disorder previously based on the patient's reporting of symptoms today.  Otherwise he denies having any psychotic symptoms at this time or in the past.  Past psychiatric history: The patient reports previously been diagnosed with both major depressive disorder and manic depression.  He reports no previous diagnosis of anxiety disorder.  Reports using marijuana to self-medicate anxiety.  Denies any previous psychiatric hospitalizations or suicide attempts.  Reports taking Prozac in the past, about 15 years ago.  Reports no current outpatient psychiatric medication prescribing.  Past medical history: Pseudoseizures, tachycardia, and hypertension.  Denies any surgical history.  NKDA.  Substance use: Patient reports using marijuana daily.  The patient is reports smoking black and miles daily.  Patient reports drinking 2-3 drinks every other day, beer.  Denies any history of alcohol withdrawal seizures or DTs.  Denies other illicit drug use.  Family history: Denies any known diagnosis of psych history in the family but does report that one of  his mother's family members ?aunt completed suicide.  Social history: Lives with grandmother  and uncle.  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  Has a fiance of 11 years.  No children.  Reports having gone to some college.   Total Time spent with patient: 30 minutes    Is the patient at risk to self? Yes.    Has the patient been a risk to self in the past 6 months? Yes.    Has the patient been a risk to self within the distant past? No.  Is the patient a risk to others? No.  Has the patient been a risk to others in the past 6 months? No.  Has the patient been a risk to others within the distant past? No.   [place] Scale:  Flowsheet Row Ad[facility] (Current) [facility] Most recent reading at 08/06/2023  3:00 PM ED to Hosp-Admission (Discharged) from 08/01/2023 in [facility] Most recent reading at 08/02/2023  7:24 AM ED from 08/01/2023 in [facility] Most recent reading at 08/01/2023 11:13 PM  C-SSRS RISK CATEGORY No Risk Moderate Risk High Risk        Prior Inpatient Therapy: No. If yes, describe  Prior Outpatient Therapy: Yes.   If yes, describe many years ago  Alcohol Screening: 1. How often do you have a drink containing alcohol?: 4 or more times a week 2. How many drinks containing alcohol do you have on a typical day when you are drinking?: 3 or 4 3. How often do you have six or more drinks on one occasion?: Never AUDIT-C Score: 5 4. How often during the last year have you found that you were not able to stop drinking once you had started?: Never 5. How often during the last year have you failed to do what was normally expected from you because of drinking?: Never 6. How often during the last year have you needed a first drink in the morning to get yourself going after a heavy drinking session?: Never 7. How often during the last year have you had a feeling of guilt of remorse after drinking?: Never 8. How often during the last year have you been  unable to remember what happened the night before because you had been drinking?: Never 9. Have you or someone else been injured as a result of your drinking?: No 10. Has a relative or friend or a doctor or another health worker been concerned about your drinking or suggested you cut down?: No Alcohol Use Disorder Identification Test Final Score (AUDIT): 5 Substance Abuse History in the last 12 months:  Yes.   Consequences of Substance Abuse: Negative Previous Psychotropic Medications: Yes  Psychological Evaluations: Yes  Past Medical History:  Past Medical History:  Diagnosis Date   Depression    Seizures (HCC)     Past Surgical History:  Procedure Laterality Date   WISDOM TOOTH EXTRACTION     Family History: History reviewed. No pertinent family history.   Tobacco Screening:  Social History   Tobacco Use  Smoking Status Every Day   Types: Cigars  Smokeless Tobacco Never    BH Tobacco Counseling     Are you interested in Tobacco Cessation Medications?  Yes, implement Nicotene Replacement Protocol Counseled patient on smoking cessation:  Refused/Declined practical counseling Reason Tobacco Screening Not Completed: No value filed.       Social History:  Social History  Substance and Sexual Activity  Alcohol Use Yes   Alcohol/week: 3.0 standard drinks of alcohol   Types: 3 Cans of beer per week     Social History   Substance and Sexual Activity  Drug Use Yes   Types: Marijuana    Additional Social History:                           Allergies:  No Known Allergies Lab Results: No results found for this or any previous visit (from the past 48 hour(s)).  Blood Alcohol level:  Lab Results  Component Value Date   ETH 248 (H) 08/02/2023   ETH <10 10/28/2022    Metabolic Disorder Labs:  No results found for: "HGBA1C", "MPG" No results found for: "PROLACTIN" No results found for: "CHOL", "TRIG", "HDL", "CHOLHDL", "VLDL", "LDLCALC"  Current  Medications: Current Facility-Administered Medications  Medication Dose Route Frequency Provider Last Rate Last Admin   acetaminophen (TYLENOL) tablet 650 mg  650 mg Oral Q6H PRN Karie Fetch, MD       alum & mag hydroxide-simeth (MAALOX/MYLANTA) 200-200-20 MG/5ML suspension 30 mL  30 mL Oral Q4H PRN Karie Fetch, MD       diphenhydrAMINE (BENADRYL) capsule 50 mg  50 mg Oral TID PRN Karie Fetch, MD       Or   diphenhydrAMINE (BENADRYL) injection 50 mg  50 mg Intramuscular TID PRN Karie Fetch, MD       Melene Muller ON 08/08/2023] FLUoxetine (PROZAC) capsule 20 mg  20 mg Oral Daily Montay Vanvoorhis, MD       folic acid (FOLVITE) tablet 1 mg  1 mg Oral Daily Karie Fetch, MD   1 mg at 08/07/23 1610   haloperidol (HALDOL) tablet 5 mg  5 mg Oral TID PRN Karie Fetch, MD       Or   haloperidol lactate (HALDOL) injection 5 mg  5 mg Intramuscular TID PRN Karie Fetch, MD       hydrOXYzine (ATARAX) tablet 25 mg  25 mg Oral TID PRN Karie Fetch, MD   25 mg at 08/06/23 1630   loperamide (IMODIUM) capsule 2-4 mg  2-4 mg Oral PRN Mirta Mally, Harrold Donath, MD       LORazepam (ATIVAN) tablet 2 mg  2 mg Oral TID PRN Karie Fetch, MD       Or   LORazepam (ATIVAN) injection 2 mg  2 mg Intramuscular TID PRN Karie Fetch, MD       LORazepam (ATIVAN) tablet 1 mg  1 mg Oral Q6H PRN Nelline Lio, MD       magnesium hydroxide (MILK OF MAGNESIA) suspension 30 mL  30 mL Oral Daily PRN Karie Fetch, MD       Melene Muller ON 08/08/2023] metoprolol tartrate (LOPRESSOR) tablet 25 mg  25 mg Oral Q12H Verley Pariseau, MD       multivitamin with minerals tablet 1 tablet  1 tablet Oral Daily Karie Fetch, MD   1 tablet at 08/07/23 9604   nicotine (NICODERM CQ - dosed in mg/24 hours) patch 14 mg  14 mg Transdermal Daily Karie Fetch, MD   14 mg at 08/07/23 0832   ondansetron (ZOFRAN-ODT) disintegrating tablet 4 mg  4 mg Oral Q6H PRN Glorianna Gott, Harrold Donath, MD       potassium chloride  (KLOR-CON) packet 20 mEq  20 mEq Oral Daily Karie Fetch, MD   20 mEq at 08/07/23 5409   thiamine (Vitamin B-1) tablet 100 mg  100 mg  Oral Daily Trecia Maring, MD   100 mg at 08/07/23 6295   traZODone (DESYREL) tablet 50 mg  50 mg Oral QHS PRN Karie Fetch, MD   50 mg at 08/06/23 2117   PTA Medications: Medications Prior to Admission  Medication Sig Dispense Refill Last Dose   FLUoxetine (PROZAC) 10 MG capsule Take 1 capsule (10 mg total) by mouth daily.      folic acid (FOLVITE) 1 MG tablet Take 1 tablet (1 mg total) by mouth daily.      metoprolol tartrate (LOPRESSOR) 25 MG tablet Take 0.5 tablets (12.5 mg total) by mouth 2 (two) times daily.      Multiple Vitamin (MULTIVITAMIN WITH MINERALS) TABS tablet Take 1 tablet by mouth daily.      nicotine (NICODERM CQ - DOSED IN MG/24 HOURS) 14 mg/24hr patch Place 1 patch (14 mg total) onto the skin daily.      ondansetron (ZOFRAN) 4 MG tablet Take 1 tablet (4 mg total) by mouth every 6 (six) hours as needed for nausea.      potassium chloride (KLOR-CON) 10 MEQ tablet Take 2 tablets (20 mEq total) by mouth daily for 7 days. (Patient not taking: Reported on 08/02/2023) 14 tablet 0    thiamine (VITAMIN B-1) 100 MG tablet Take 1 tablet (100 mg total) by mouth daily.       Musculoskeletal: Strength & Muscle Tone: within normal limits Gait & Station: normal Patient leans: N/A            Psychiatric Specialty Exam:  Presentation  General Appearance:  Casual; Fairly Groomed  Eye Contact: Fair  Speech: Normal Rate  Speech Volume: Normal  Handedness: Right   Mood and Affect  Mood: Anxious  Affect: Full Range   Thought Process  Thought Processes: Linear  Duration of Psychotic Symptoms: n/a Past Diagnosis of Schizophrenia or Psychoactive disorder: No  Descriptions of Associations:Intact  Orientation:Full (Time, Place and Person)  Thought Content:Logical  Hallucinations:Hallucinations: None  Ideas  of Reference:None  Suicidal Thoughts:Suicidal Thoughts: No  Homicidal Thoughts:Homicidal Thoughts: No   Sensorium  Memory: Recent Good; Remote Good; Immediate Good  Judgment: Fair  Insight: Fair   Chartered certified accountant: Fair  Attention Span: Fair  Recall: [employer] of Knowledge: Fair  Language: Fair   Psychomotor Activity  Psychomotor Activity: Psychomotor Activity: Normal   Assets  Assets: Communication Skills; Social Support; Desire for Improvement; Housing   Sleep  Sleep: Sleep: Fair    Physical Exam: Physical Exam Vitals reviewed.  Constitutional:      General: He is not in acute distress.    Appearance: He is normal weight. He is not toxic-appearing.  Pulmonary:     Effort: Pulmonary effort is normal. No respiratory distress.  Neurological:     Mental Status: He is alert.     Motor: No weakness.     Gait: Gait normal.  Psychiatric:        Behavior: Behavior normal.        Judgment: Judgment normal.    Review of Systems  Constitutional:  Negative for chills and fever.  Cardiovascular:  Negative for chest pain and palpitations.  Neurological:  Negative for dizziness, tingling, tremors and headaches.  Psychiatric/Behavioral:  Positive for substance abuse. Negative for depression, hallucinations, memory loss and suicidal ideas. The patient is nervous/anxious. The patient does not have insomnia.   All other systems reviewed and are negative.  Blood pressure (!) 136/104, pulse (!) 132, temperature 98 F (36.7 C), temperature  source Oral, resp. rate 18, height 5\' 8"  (1.727 m), weight 58.2 kg, SpO2 99%. Body mass index is 19.52 kg/m.  Treatment Plan Summary: Daily contact with patient to assess and evaluate symptoms and progress in treatment and Medication management  ASSESSMENT:  Diagnoses / Active Problems: GAD with panic attacks History of MDD, patient not reporting symptoms consistent with a current depressive  episode  Cannabis abuse, rule out cannabis use disorder Alcohol abuse, rule out alcohol use disorder Tobacco abuse  PLAN: Safety and Monitoring:  -- Involuntary admission to inpatient psychiatric unit for safety, stabilization and treatment.  Will hold IVC due to the patient's minimizing of psychiatric symptoms and also suicidal thoughts to ED staff, see note from K Vice on 10-18.    -- Daily contact with patient to assess and evaluate symptoms and progress in treatment  -- Patient's case to be discussed in multi-disciplinary team meeting  -- Observation Level : q15 minute checks  -- Vital signs:  q12 hours  -- Precautions: suicide, elopement, and assault  2. Psychiatric Diagnoses and Treatment:    -Increase Prozac from 10 mg once daily to 20 mg once daily for GAD.  His medication was started while he was in the medical hospital.  -Continue off of gabapentin (patient does not want to restart this and this was not recommended at discharge from [facility])  -Metoprolol from 12.5 mg twice daily to 25 mg twice daily for tachycardia and hypertension  -Give 1 dose of Ativan now for tachycardia (thought to be secondary to alcohol withdrawal versus anxiety)  --  The risks/benefits/side-effects/alternatives to this medication were discussed in detail with the patient and time was given for questions. The patient consents to medication trial.    -- Metabolic profile and EKG monitoring obtained while on an atypical antipsychotic (BMI: Lipid Panel: HbgA1c: QTc:)   -- Encouraged patient to participate in unit milieu and in scheduled group therapies   -- Short Term Goals: Ability to identify changes in lifestyle to reduce recurrence of condition will improve, Ability to verbalize feelings will improve, Ability to disclose and discuss suicidal ideas, Ability to demonstrate self-control will improve, Ability to identify and develop effective coping behaviors will improve, Ability to maintain  clinical measurements within normal limits will improve, Compliance with prescribed medications will improve, and Ability to identify triggers associated with substance abuse/mental health issues will improve  -- Long Term Goals: Improvement in symptoms so as ready for discharge    3. Medical Issues Being Addressed:   Tobacco Use Disorder  -- Nicotine patch 21mg /24 hours ordered  -- Smoking cessation encouraged  4. Discharge Planning:   -- Social work and case management to assist with discharge planning and identification of hospital follow-up needs prior to discharge  -- Estimated LOS: 5-7 days  -- Discharge Concerns: Need to establish a safety plan; Medication compliance and effectiveness  -- Discharge Goals: Return home with outpatient referrals for mental health follow-up including medication management/psychotherapy    I certify that inpatient services furnished can reasonably be expected to improve the patient's condition.    Phineas Inches, MD 10/23/20245:21 PM  Total Time Spent in Direct Patient Care:  I personally spent 60 minutes on the unit in direct patient care. The direct patient care time included face-to-face time with the patient, reviewing the patient's chart, communicating with other professionals, and coordinating care. Greater than 50% of this time was spent in counseling or coordinating care with the patient regarding goals of hospitalization, psycho-education, and  discharge planning needs.   Phineas Inches, MD Psychiatrist

## 2023-08-07 NOTE — [facility] Group Notes (Signed)
[facility] Group Notes:  (Nursing/MHT/Case Management/Adjunct)  Date:  08/07/2023  Time:  8:58 AM  Type of Therapy:  Group Topic/ Focus: Goals Group: The focus of this group is to help patients establish daily goals to achieve during treatment and discuss how the patient can incorporate goal setting into their daily lives to aide in recovery.   Participation Level:  Active  Participation Quality:  Appropriate  Affect:  Appropriate  Cognitive:  Appropriate  Insight:  Appropriate  Engagement in Group:  Engaged  Modes of Intervention:  Discussion  Summary of Progress/Problems:  Patient attended and participated goals group today. Patient's goal for today is to get some exercise and socialize more.   Daneil Dan 08/07/2023, 8:58 AM

## 2023-08-07 NOTE — Progress Notes (Signed)
   08/07/23 0835  Psych Admission Type (Psych Patients Only)  Admission Status Involuntary  Psychosocial Assessment  Patient Complaints Anxiety  Eye Contact Fair  Facial Expression Anxious  Affect Apathetic  Speech Soft  Interaction Assertive  Motor Activity Restless  Appearance/Hygiene Unremarkable  Behavior Characteristics Anxious  Mood Anxious  Thought Process  Coherency Blocking  Content Blaming others  Delusions None reported or observed  Perception WDL  Hallucination None reported or observed  Judgment Limited  Confusion None  Danger to Self  Current suicidal ideation? Denies  Agreement Not to Harm Self Yes  Description of Agreement Verbal  Danger to Others  Danger to Others None reported or observed

## 2023-08-07 NOTE — BH IP Treatment Plan (Signed)
Interdisciplinary Treatment and Diagnostic Plan   08/07/2023 Time of Session: 1135 Stephen [name] MRN: [id number]  Principal Diagnosis: Major depressive disorder, recurrent episode, severe (HCC)  Secondary Diagnoses: Principal Problem:   Major depressive disorder, recurrent episode, severe (HCC)   Current Medications:  Current Facility-Administered Medications  Medication Dose Route Frequency Provider Last Rate Last Admin   acetaminophen (TYLENOL) tablet 650 mg  650 mg Oral Q6H PRN Karie Fetch, MD       alum & mag hydroxide-simeth (MAALOX/MYLANTA) 200-200-20 MG/5ML suspension 30 mL  30 mL Oral Q4H PRN Karie Fetch, MD       diphenhydrAMINE (BENADRYL) capsule 50 mg  50 mg Oral TID PRN Karie Fetch, MD       Or   diphenhydrAMINE (BENADRYL) injection 50 mg  50 mg Intramuscular TID PRN Karie Fetch, MD       FLUoxetine (PROZAC) capsule 10 mg  10 mg Oral Daily Karie Fetch, MD   10 mg at 08/07/23 2536   folic acid (FOLVITE) tablet 1 mg  1 mg Oral Daily Karie Fetch, MD   1 mg at 08/07/23 6440   haloperidol (HALDOL) tablet 5 mg  5 mg Oral TID PRN Karie Fetch, MD       Or   haloperidol lactate (HALDOL) injection 5 mg  5 mg Intramuscular TID PRN Karie Fetch, MD       hydrOXYzine (ATARAX) tablet 25 mg  25 mg Oral TID PRN Karie Fetch, MD   25 mg at 08/06/23 1630   hydrOXYzine (ATARAX) tablet 25 mg  25 mg Oral Q6H PRN Phineas Inches, MD   25 mg at 08/07/23 3474   loperamide (IMODIUM) capsule 2-4 mg  2-4 mg Oral PRN Massengill, Harrold Donath, MD       LORazepam (ATIVAN) tablet 2 mg  2 mg Oral TID PRN Karie Fetch, MD       Or   LORazepam (ATIVAN) injection 2 mg  2 mg Intramuscular TID PRN Karie Fetch, MD       LORazepam (ATIVAN) tablet 1 mg  1 mg Oral Q6H PRN Massengill, Nathan, MD       magnesium hydroxide (MILK OF MAGNESIA) suspension 30 mL  30 mL Oral Daily PRN Karie Fetch, MD       metoprolol tartrate (LOPRESSOR) tablet 12.5 mg  12.5 mg Oral  BID Karie Fetch, MD   12.5 mg at 08/07/23 2595   multivitamin with minerals tablet 1 tablet  1 tablet Oral Daily Karie Fetch, MD   1 tablet at 08/07/23 6387   nicotine (NICODERM CQ - dosed in mg/24 hours) patch 14 mg  14 mg Transdermal Daily Karie Fetch, MD   14 mg at 08/07/23 0832   ondansetron (ZOFRAN-ODT) disintegrating tablet 4 mg  4 mg Oral Q6H PRN Massengill, Harrold Donath, MD       potassium chloride (KLOR-CON) packet 20 mEq  20 mEq Oral Daily Karie Fetch, MD   20 mEq at 08/07/23 5643   thiamine (Vitamin B-1) tablet 100 mg  100 mg Oral Daily Massengill, Harrold Donath, MD   100 mg at 08/07/23 3295   traZODone (DESYREL) tablet 50 mg  50 mg Oral QHS PRN Karie Fetch, MD   50 mg at 08/06/23 2117   PTA Medications: Medications Prior to Admission  Medication Sig Dispense Refill Last Dose   FLUoxetine (PROZAC) 10 MG capsule Take 1 capsule (10 mg total) by mouth daily.      folic acid (FOLVITE) 1 MG tablet Take 1 tablet (1 mg total)  by mouth daily.      metoprolol tartrate (LOPRESSOR) 25 MG tablet Take 0.5 tablets (12.5 mg total) by mouth 2 (two) times daily.      Multiple Vitamin (MULTIVITAMIN WITH MINERALS) TABS tablet Take 1 tablet by mouth daily.      nicotine (NICODERM CQ - DOSED IN MG/24 HOURS) 14 mg/24hr patch Place 1 patch (14 mg total) onto the skin daily.      ondansetron (ZOFRAN) 4 MG tablet Take 1 tablet (4 mg total) by mouth every 6 (six) hours as needed for nausea.      potassium chloride (KLOR-CON) 10 MEQ tablet Take 2 tablets (20 mEq total) by mouth daily for 7 days. (Patient not taking: Reported on 08/02/2023) 14 tablet 0    thiamine (VITAMIN B-1) 100 MG tablet Take 1 tablet (100 mg total) by mouth daily.       Patient Stressors: Health problems   Substance abuse   Traumatic event    Patient Strengths: Capable of independent living  Manufacturing systems engineer  Motivation for treatment/growth  Supportive family/friends  Work skills   Treatment Modalities: Medication  Management, Group therapy, Case management,  1 to 1 session with clinician, Psychoeducation, Recreational therapy.   Physician Treatment Plan for Primary Diagnosis: Major depressive disorder, recurrent episode, severe (HCC) Long Term Goal(s):     Short Term Goals:    Medication Management: Evaluate patient's response, side effects, and tolerance of medication regimen.  Therapeutic Interventions: 1 to 1 sessions, Unit Group sessions and Medication administration.  Evaluation of Outcomes: Progressing  Physician Treatment Plan for Secondary Diagnosis: Principal Problem:   Major depressive disorder, recurrent episode, severe (HCC)  Long Term Goal(s):     Short Term Goals:       Medication Management: Evaluate patient's response, side effects, and tolerance of medication regimen.  Therapeutic Interventions: 1 to 1 sessions, Unit Group sessions and Medication administration.  Evaluation of Outcomes: Progressing   RN Treatment Plan for Primary Diagnosis: Major depressive disorder, recurrent episode, severe (HCC) Long Term Goal(s): Knowledge of disease and therapeutic regimen to maintain health will improve  Short Term Goals: Ability to remain free from injury will improve, Ability to verbalize frustration and anger appropriately will improve, Ability to demonstrate self-control, Ability to participate in decision making will improve, Ability to verbalize feelings will improve, Ability to disclose and discuss suicidal ideas, Ability to identify and develop effective coping behaviors will improve, and Compliance with prescribed medications will improve  Medication Management: RN will administer medications as ordered by provider, will assess and evaluate patient's response and provide education to patient for prescribed medication. RN will report any adverse and/or side effects to prescribing provider.  Therapeutic Interventions: 1 on 1 counseling sessions, Psychoeducation, Medication  administration, Evaluate responses to treatment, Monitor vital signs and CBGs as ordered, Perform/monitor CIWA, COWS, AIMS and Fall Risk screenings as ordered, Perform wound care treatments as ordered.  Evaluation of Outcomes: Progressing   LCSW Treatment Plan for Primary Diagnosis: Major depressive disorder, recurrent episode, severe (HCC) Long Term Goal(s): Safe transition to appropriate next level of care at discharge, Engage patient in therapeutic group addressing interpersonal concerns.  Short Term Goals: Engage patient in aftercare planning with referrals and resources, Increase social support, Increase ability to appropriately verbalize feelings, Increase emotional regulation, Facilitate acceptance of mental health diagnosis and concerns, Facilitate patient progression through stages of change regarding substance use diagnoses and concerns, Identify triggers associated with mental health/substance abuse issues, and Increase skills for wellness and recovery  Therapeutic Interventions: Assess for all discharge needs, 1 to 1 time with Child psychotherapist, Explore available resources and support systems, Assess for adequacy in community support network, Educate family and significant other(s) on suicide prevention, Complete Psychosocial Assessment, Interpersonal group therapy.  Evaluation of Outcomes: Progressing   Progress in Treatment: Attending groups: Yes. Participating in groups: Yes. Taking medication as prescribed: Yes. Toleration medication: Yes. Family/Significant other contact made: No, will contact:  Consents Pending Patient understands diagnosis: Yes. Discussing patient identified problems/goals with staff: Yes. Medical problems stabilized or resolved: Yes. Denies suicidal/homicidal ideation: Yes. Issues/concerns per patient self-inventory: Yes. Other: N/A  New problem(s) identified: No, Describe:  None reported  New Short Term/Long Term Goal(s): medication stabilization,  elimination of SI thoughts, development of comprehensive mental wellness plan.     Patient Goals:  Medication Stabilization  Discharge Plan or Barriers: SW will continue to follow and assess for appropriate referrals and possible discharge planning.     Reason for Continuation of Hospitalization: Depression Medication stabilization Suicidal ideation Withdrawal symptoms  Estimated Length of Stay: 5-7 Days  Last 3 [place] Suicide Severity Risk Score: Flowsheet Row [facility]Current[facility] Most recent reading at 08/06/2023  3:00 PM ED to Hosp-Admission (Discharged) from 08/01/2023 in [facility] Most recent reading at 08/02/2023  7:24 AM ED from 08/01/2023 in [facility] Most recent reading at 08/01/2023 11:13 PM  C-SSRS RISK CATEGORY No Risk Moderate Risk High Risk       Last PHQ 2/9 Scores:     No data to display        detox, medication management for mood stabilization; elimination of SI thoughts; development of comprehensive mental wellness/sobriety plan   Scribe for Treatment Team: Ane Payment, LCSW 08/07/2023 11:37 AM

## 2023-08-07 NOTE — BHH Suicide Risk Assessment (Signed)
[facility] Admission Suicide Risk Assessment   Nursing information obtained from:  Patient Demographic factors:  Male, Caucasian Current Mental Status:  NA Loss Factors:  NA Historical Factors:  NA Risk Reduction Factors:  Sense of responsibility to family, Living with another person, especially a relative, Employed, Positive social support, Positive therapeutic relationship  Total Time spent with patient: 30 minutes Principal Problem: Major depressive disorder, recurrent episode, severe (HCC) Diagnosis:  Principal Problem:   Major depressive disorder, recurrent episode, severe (HCC)  Subjective Data: See H&P.  Patient admitted from medical Hospital for concern for severe anxiety and suicidal thoughts.  Not been taking medication for many years.  Was restarted on psychiatric medication at the medical Hospital.  Reports he has been recently been on a mental breakdown, and feeling lost, having crying spells, having emotional lability and had said messages to his fiance about him hurting himself, having thoughts to cut himself.  Continued Clinical Symptoms:  Alcohol Use Disorder Identification Test Final Score (AUDIT): 5 The "Alcohol Use Disorders Identification Test", Guidelines for Use in Primary Care, Second Edition.  World Science writer [facility]). Score between 0-7:  no or low risk or alcohol related problems. Score between 8-15:  moderate risk of alcohol related problems. Score between 16-19:  high risk of alcohol related problems. Score 20 or above:  warrants further diagnostic evaluation for alcohol dependence and treatment.   CLINICAL FACTORS:   Severe Anxiety and/or Agitation Panic Attacks Obsessive-Compulsive Disorder More than one psychiatric diagnosis Unstable or Poor Therapeutic Relationship Previous Psychiatric Diagnoses and Treatments   Musculoskeletal: Strength & Muscle Tone: within normal limits Gait & Station: normal Patient leans: N/A  Psychiatric Specialty  Exam:  Presentation  General Appearance:  Casual; Fairly Groomed  Eye Contact: Fair  Speech: Normal Rate  Speech Volume: Normal  Handedness: Right   Mood and Affect  Mood: Anxious  Affect: Full Range   Thought Process  Thought Processes: Linear  Descriptions of Associations:Intact  Orientation:Full (Time, Place and Person)  Thought Content:Logical  History of Schizophrenia/Schizoaffective disorder:No  Duration of Psychotic Symptoms:No data recorded Hallucinations:Hallucinations: None  Ideas of Reference:None  Suicidal Thoughts:Suicidal Thoughts: No  Homicidal Thoughts:Homicidal Thoughts: No   Sensorium  Memory: Recent Good; Remote Good; Immediate Good  Judgment: Fair  Insight: Fair   Chartered certified accountant: Fair  Attention Span: Fair  Recall: [employer] of Knowledge: Fair  Language: Fair   Psychomotor Activity  Psychomotor Activity: Psychomotor Activity: Normal   Assets  Assets: Communication Skills; Social Support; Desire for Improvement; Housing   Sleep  Sleep: Sleep: Fair    Physical Exam: Physical Exam see H&P ROS  see H&P Blood pressure (!) 136/104, pulse (!) 132, temperature 98 F (36.7 C), temperature source Oral, resp. rate 18, height 5\' 8"  (1.727 m), weight 58.2 kg, SpO2 99%. Body mass index is 19.52 kg/m.   COGNITIVE FEATURES THAT CONTRIBUTE TO RISK:  None    SUICIDE RISK:   Moderate:  suicidal ideation with limited intensity, and duration, some specificity in terms of plans, no associated intent, good self-control, limited dysphoria/symptomatology, some risk factors present, and identifiable protective factors, including available and accessible social support.  PLAN OF CARE: See H&P  I certify that inpatient services furnished can reasonably be expected to improve the patient's condition.   Phineas Inches, MD 08/07/2023, 5:18 PM

## 2023-08-07 NOTE — Group Note (Signed)
Date:  08/07/2023 Time:  11:12 PM  Group Topic/Focus:  Narcotics Anonymous (NA) Meeting    Participation Level:  Active  Participation Quality:  Appropriate  Affect:  Appropriate  Cognitive:  Appropriate  Insight: Appropriate  Engagement in Group:  Engaged  Modes of Intervention:  Discussion and Support  Additional Comments:  Patient attended NA Meeting.   Kennieth Francois 08/07/2023, 11:12 PM

## 2023-08-07 NOTE — Progress Notes (Signed)
   08/06/23 2000  Psychosocial Assessment  Patient Complaints Anxiety;Substance abuse  Eye Contact Fair  Facial Expression Anxious  Affect Anxious  Speech Soft  Interaction Assertive  Motor Activity Restless  Appearance/Hygiene Unremarkable  Behavior Characteristics Anxious  Mood Anxious  Thought Process  Coherency Blocking  Content Blaming others  Delusions None reported or observed  Perception WDL  Hallucination None reported or observed  Judgment Limited  Confusion None  Danger to Self  Current suicidal ideation?  (Denies)  Agreement Not to Harm Self Yes  Description of Agreement verbal   Patient is compliant with medications denies SI/HI/A/VH and verbally contracts for safety. Endorses anxiety prn Vistaril given and Trazodone for sleep. Medications were effective. Q 15 minutes safety checks ongoing.Support and encouragement provided.

## 2023-08-07 NOTE — Progress Notes (Signed)
D) Pt received calm, visible, participating in milieu, and in no acute distress. Pt A & O x4. Pt denies SI, HI, A/ V H, depression, anxiety and pain at this time. A) Pt encouraged to drink fluids. Pt encouraged to come to staff with needs. Pt encouraged to attend and participate in groups. Pt encouraged to set reachable goals.  R) Pt remained safe on unit, in no acute distress, will continue to assess.   Pt reports he is feeling less anxious compared to his home stressors.     08/07/23 2200  Psych Admission Type (Psych Patients Only)  Admission Status Involuntary  Psychosocial Assessment  Patient Complaints Anxiety;Substance abuse  Eye Contact Fair  Facial Expression Anxious  Affect Apathetic  Speech Unremarkable  Interaction Assertive  Motor Activity Restless  Appearance/Hygiene Unremarkable  Behavior Characteristics Anxious  Mood Anxious  Thought Process  Coherency WDL  Content Blaming others  Delusions None reported or observed  Perception WDL  Hallucination None reported or observed  Judgment Limited  Confusion None  Danger to Self  Current suicidal ideation? Denies  Agreement Not to Harm Self Yes  Description of Agreement verbal  Danger to Others  Danger to Others None reported or observed

## 2023-08-07 NOTE — BHH Group Notes (Signed)

## 2023-08-07 NOTE — Plan of Care (Signed)
  Problem: Education: Goal: Emotional status will improve 08/07/2023 1055 by Gardiner Barefoot, RN Outcome: Progressing 08/07/2023 1054 by Gardiner Barefoot, RN Outcome: Progressing Goal: Mental status will improve 08/07/2023 1055 by Gardiner Barefoot, RN Outcome: Progressing 08/07/2023 1054 by Gardiner Barefoot, RN Outcome: Progressing Goal: Verbalization of understanding the information provided will improve 08/07/2023 1055 by Gardiner Barefoot, RN Outcome: Progressing 08/07/2023 1054 by Gardiner Barefoot, RN Outcome: Progressing

## 2023-08-08 DIAGNOSIS — F332 Major depressive disorder, recurrent severe without psychotic features: Secondary | ICD-10-CM | POA: Diagnosis not present

## 2023-08-08 LAB — COMPREHENSIVE METABOLIC PANEL
ALT: 123 U/L — ABNORMAL HIGH (ref 0–44)
AST: 77 U/L — ABNORMAL HIGH (ref 15–41)
Albumin: 4.1 g/dL (ref 3.5–5.0)
Alkaline Phosphatase: 96 U/L (ref 38–126)
Anion gap: 8 (ref 5–15)
BUN: 16 mg/dL (ref 6–20)
CO2: 27 mmol/L (ref 22–32)
Calcium: 9.1 mg/dL (ref 8.9–10.3)
Chloride: 98 mmol/L (ref 98–111)
Creatinine, Ser: 0.77 mg/dL (ref 0.61–1.24)
GFR, Estimated: >60 mL/min (ref >60)
Glucose, Bld: 80 mg/dL (ref 70–99)
Potassium: 3.8 mmol/L (ref 3.5–5.1)
Sodium: 133 mmol/L — ABNORMAL LOW (ref 135–145)
Total Bilirubin: 0.3 mg/dL (ref 0.3–1.2)
Total Protein: 6.9 g/dL (ref 6.5–8.1)

## 2023-08-08 LAB — HEPATITIS PANEL, ACUTE
HCV Ab: NONREACTIVE
Hep A IgM: NONREACTIVE
Hep B C IgM: NONREACTIVE
Hepatitis B Surface Ag: NONREACTIVE

## 2023-08-08 MED ORDER — CLONIDINE HCL 0.1 MG PO TABS
0.1000 mg | ORAL_TABLET | Freq: Two times a day (BID) | ORAL | Status: DC
  Administered 2023-08-08 – 2023-08-09 (×3): 0.1 mg via ORAL
  Filled 2023-08-08 (×8): qty 1

## 2023-08-08 MED ORDER — CLONIDINE HCL 0.1 MG PO TABS
0.1000 mg | ORAL_TABLET | ORAL | Status: DC | PRN
Start: 2023-08-08 — End: 2023-08-09

## 2023-08-08 NOTE — Progress Notes (Signed)
Pt awake and alert. Visible in milieu and participating in groups. Maintaining appropriate boundaries .   Will cont to monitor, encourage and support.   08/08/23 1000  Psych Admission Type (Psych Patients Only)  Admission Status Involuntary  Psychosocial Assessment  Patient Complaints Anxiety;Substance abuse  Eye Contact Fair  Facial Expression Anxious  Affect Appropriate to circumstance  Speech Unremarkable  Interaction Assertive  Motor Activity Restless  Appearance/Hygiene Unremarkable  Behavior Characteristics Anxious  Mood Anxious  Thought Process  Coherency WDL  Content Blaming others  Delusions None reported or observed  Perception WDL  Hallucination None reported or observed  Judgment Limited  Confusion None  Danger to Self  Current suicidal ideation? Denies  Agreement Not to Harm Self Yes  Description of Agreement verbal  Danger to Others  Danger to Others None reported or observed

## 2023-08-08 NOTE — Plan of Care (Signed)
  Problem: Education: Goal: Knowledge of [place] [facility] Education information/materials will improve Outcome: Progressing Goal: Emotional status will improve Outcome: Progressing Goal: Mental status will improve Outcome: Progressing Goal: Verbalization of understanding the information provided will improve Outcome: Progressing   Problem: Activity: Goal: Interest or engagement in activities will improve Outcome: Progressing Goal: Sleeping patterns will improve Outcome: Progressing   Problem: Health Behavior/Discharge Planning: Goal: Identification of resources available to assist in meeting health care needs will improve Outcome: Progressing Goal: Compliance with treatment plan for underlying cause of condition will improve Outcome: Progressing   
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
## 2023-08-08 NOTE — Group Note (Signed)
Date:  08/08/2023 Time:  12:02 PM  Group Topic/Focus:  Goals Group:   The focus of this group is to help patients establish daily goals to achieve during treatment and discuss how the patient can incorporate goal setting into their daily lives to aide in recovery.    Participation Level:  Minimal  Participation Quality:  Attentive  Affect:  Appropriate  Cognitive:  Appropriate  Insight: Good  Engagement in Group:  Lacking  Modes of Intervention:  Discussion  Additional Comments:     Reymundo Poll 08/08/2023, 12:02 PM

## 2023-08-08 NOTE — [facility] Group Notes (Signed)
[facility] Group Notes:  (Nursing/MHT/Case Management/Adjunct)  Date:  08/08/2023  Time:  10:03 PM  Type of Therapy:  Psychoeducational Skills  Participation Level:  Active  Participation Quality:  Appropriate  Affect:  Appropriate  Cognitive:  Appropriate  Insight:  Good  Engagement in Group:  Engaged  Modes of Intervention:  Education  Summary of Progress/Problems: The patient rated his day as an 8 or 9 out of 10 since he went outside and had a good visit with his fiance. He also shared in group that he had "more good moments" versus bad moments today. He also expressed his concern about having a high pulse.   Stephen [name] S 08/08/2023, 10:03 PM

## 2023-08-08 NOTE — Group Note (Signed)
LCSW Group Therapy Note   Group Date: 08/08/2023 Start Time: 1100 End Time: 1200   Type of Therapy and Topic:  Group Therapy: Challenging Core Beliefs  Participation Level:  Active  Description of Group:  Patients were educated about core beliefs and asked to identify one harmful core belief that they have. Patients were asked to explore from where those beliefs originate. Patients were asked to discuss how those beliefs make them feel and the resulting behaviors of those beliefs. They were then be asked if those beliefs are true and, if so, what evidence they have to support them. Lastly, group members were challenged to replace those negative core beliefs with helpful beliefs.   Therapeutic Goals:   1. Patient will identify harmful core beliefs and explore the origins of such beliefs. 2. Patient will identify feelings and behaviors that result from those core beliefs. 3. Patient will discuss whether such beliefs are true. 4.  Patient will replace harmful core beliefs with helpful ones.  Summary of Patient Progress:  Stephen [name] actively engaged in processing and exploring how core beliefs are formed and how they impact thoughts, feelings, and behaviors. Patient proved open to input from peers and feedback from CSW. Patient demonstrated positive insight into the subject matter, was respectful and supportive of peers, and participated throughout the entire session.  Therapeutic Modalities: Cognitive Behavioral Therapy; Solution-Focused Therapy   Kathi Der, LCSWA 08/08/2023  12:25 PM

## 2023-08-08 NOTE — [facility] Suicide Risk Assessment (Signed)
[facility] INPATIENT:  Family/Significant Other Suicide Prevention Education  Suicide Prevention Education:  Education Completed; Barbaraann Boys [phone]) [phone],  has been identified by the patient as the family member/significant other with whom the patient will be residing, and identified as the person(s) who will aid the patient in the event of a mental health crisis (suicidal ideations/suicide attempt).  With written consent from the patient, the family member/significant other has been provided the following suicide prevention education, prior to the and/or following the discharge of the patient.  The suicide prevention education provided includes the following: Suicide risk factors Suicide prevention and interventions National Suicide Hotline telephone number [facility] assessment telephone number [facility] Emergency Assistance 911 [facility] and/or Residential Mobile Crisis Unit telephone number  Request made of family/significant other to: Remove weapons (e.g., guns, rifles, knives), all items previously/currently identified as safety concern.   Remove drugs/medications (over-the-counter, prescriptions, illicit drugs), all items previously/currently identified as a safety concern.  Barbaraann Boys [phone]) [phone] verbalizes understanding of the suicide prevention education information provided.  The family member/significant other agrees to remove the items of safety concern listed above.  Brecklyn Galvis S Calub Tarnow 08/08/2023, 2:54 PM

## 2023-08-08 NOTE — Plan of Care (Signed)
  Problem: Education: Goal: Knowledge of [facility] Education information/materials will improve Outcome: Progressing Goal: Emotional status will improve Outcome: Progressing Goal: Mental status will improve Outcome: Progressing Goal: Verbalization of understanding the information provided will improve Outcome: Progressing   

## 2023-08-08 NOTE — Progress Notes (Signed)
[facility] MD Progress Note  08/08/2023 11:05 AM Stephen [name]  MRN:  [id number]  Subjective:    Patient is a 32 year old male with a reported psychiatric history of major depressive disorder and manic depression, who was admitted to the psychiatric hospital from the medical hospital for worsening anxiety, panic attacks, and suicidal thoughts. Patient was initially admitted to the medical hospital for seizure-like activity.   Yesterday the psychiatry team made the following recommendations: Increase Prozac to 20 mg once daily for GAD  On assessment today, patient reports that since increasing the Prozac dose yesterday, he feels that his anxiety is significantly less.  He reports he is surprised that the medication is starting to work so quickly.  He reports that he is able to be more motivated to engage around others and less uncomfortable due to anxiety around other people.  He denies feeling down depressed or sad.  Denies any suicidal thoughts.  Reports that sleep is better with trazodone.  Reports appetite is okay.  He gives consent for this writer to call his fianc Barbaraann Boys [phone].  He otherwise denies having side effects to current psychiatric medications.    Principal Problem: GAD (generalized anxiety disorder) Diagnosis: Principal Problem:   GAD (generalized anxiety disorder) Active Problems:   Seizure-like activity (HCC)   Severe episode of recurrent major depressive disorder, without psychotic features (HCC)   Cannabis abuse   Tobacco abuse   Alcohol abuse  Total Time spent with patient: 20 minutes  Past Psychiatric History:  The patient reports previously been diagnosed with both major depressive disorder and manic depression. He reports no previous diagnosis of anxiety disorder. Reports using marijuana to self-medicate anxiety. Denies any previous psychiatric hospitalizations or suicide attempts. Reports taking Prozac in the past, about 15 years ago. Reports no current  outpatient psychiatric medication prescribing.     Past Medical History:  Past Medical History:  Diagnosis Date   Depression    Seizures (HCC)     Past Surgical History:  Procedure Laterality Date   WISDOM TOOTH EXTRACTION     Family History: History reviewed. No pertinent family history. Family Psychiatric  History: See H&P  Social History:  Social History   Substance and Sexual Activity  Alcohol Use Yes   Alcohol/week: 3.0 standard drinks of alcohol   Types: 3 Cans of beer per week     Social History   Substance and Sexual Activity  Drug Use Yes   Types: Marijuana    Social History   Socioeconomic History   Marital status: Single    Spouse name: Not on file   Number of children: Not on file   Years of education: Not on file   Highest education level: Not on file  Occupational History   Not on file  Tobacco Use   Smoking status: Every Day    Types: Cigars   Smokeless tobacco: Never  Vaping Use   Vaping status: Never Used  Substance and Sexual Activity   Alcohol use: Yes    Alcohol/week: 3.0 standard drinks of alcohol    Types: 3 Cans of beer per week   Drug use: Yes    Types: Marijuana   Sexual activity: Not on file  Other Topics Concern   Not on file  Social History Narrative   Not on file   Social Determinants of Health   Financial Resource Strain: Not on file  Food Insecurity: No Food Insecurity (08/06/2023)   Hunger Vital Sign    Worried About  Running Out of Food in the Last Year: Never true    Ran Out of Food in the Last Year: Never true  Transportation Needs: No Transportation Needs (08/06/2023)   PRAPARE - Administrator, Civil Service (Medical): No    Lack of Transportation (Non-Medical): No  Physical Activity: Not on file  Stress: Not on file  Social Connections: Not on file   Additional Social History:                          Current Medications: Current Facility-Administered Medications  Medication Dose  Route Frequency Provider Last Rate Last Admin   acetaminophen (TYLENOL) tablet 650 mg  650 mg Oral Q6H PRN Karie Fetch, MD       alum & mag hydroxide-simeth (MAALOX/MYLANTA) 200-200-20 MG/5ML suspension 30 mL  30 mL Oral Q4H PRN Karie Fetch, MD       cloNIDine (CATAPRES) tablet 0.1 mg  0.1 mg Oral Q4H PRN Nilsa Macht, Harrold Donath, MD       cloNIDine (CATAPRES) tablet 0.1 mg  0.1 mg Oral Q12H Niamh Rada, Harrold Donath, MD   0.1 mg at 08/08/23 1610   diphenhydrAMINE (BENADRYL) capsule 50 mg  50 mg Oral TID PRN Karie Fetch, MD       Or   diphenhydrAMINE (BENADRYL) injection 50 mg  50 mg Intramuscular TID PRN Karie Fetch, MD       FLUoxetine (PROZAC) capsule 20 mg  20 mg Oral Daily Cenia Zaragosa, Harrold Donath, MD   20 mg at 08/08/23 0800   folic acid (FOLVITE) tablet 1 mg  1 mg Oral Daily Karie Fetch, MD   1 mg at 08/08/23 0801   haloperidol (HALDOL) tablet 5 mg  5 mg Oral TID PRN Karie Fetch, MD       Or   haloperidol lactate (HALDOL) injection 5 mg  5 mg Intramuscular TID PRN Karie Fetch, MD       hydrOXYzine (ATARAX) tablet 25 mg  25 mg Oral TID PRN Karie Fetch, MD   25 mg at 08/07/23 2110   loperamide (IMODIUM) capsule 2-4 mg  2-4 mg Oral PRN Ramona Ruark, Harrold Donath, MD       LORazepam (ATIVAN) tablet 2 mg  2 mg Oral TID PRN Karie Fetch, MD       Or   LORazepam (ATIVAN) injection 2 mg  2 mg Intramuscular TID PRN Karie Fetch, MD       LORazepam (ATIVAN) tablet 1 mg  1 mg Oral Q6H PRN Nickholas Goldston, MD       magnesium hydroxide (MILK OF MAGNESIA) suspension 30 mL  30 mL Oral Daily PRN Karie Fetch, MD       metoprolol tartrate (LOPRESSOR) tablet 25 mg  25 mg Oral Q12H Vallarie Fei, Harrold Donath, MD   25 mg at 08/08/23 0801   multivitamin with minerals tablet 1 tablet  1 tablet Oral Daily Karie Fetch, MD   1 tablet at 08/08/23 0802   nicotine (NICODERM CQ - dosed in mg/24 hours) patch 14 mg  14 mg Transdermal Daily Karie Fetch, MD   14 mg at 08/08/23 0802    ondansetron (ZOFRAN-ODT) disintegrating tablet 4 mg  4 mg Oral Q6H PRN Malaisha Silliman, Harrold Donath, MD       potassium chloride (KLOR-CON) packet 20 mEq  20 mEq Oral Daily Karie Fetch, MD   20 mEq at 08/08/23 0803   thiamine (Vitamin B-1) tablet 100 mg  100 mg Oral Daily Vernice Bowker, Harrold Donath, MD   100 mg  at 08/08/23 0803   traZODone (DESYREL) tablet 50 mg  50 mg Oral QHS PRN Karie Fetch, MD   50 mg at 08/07/23 2110    Lab Results: No results found for this or any previous visit (from the past 48 hour(s)).  Blood Alcohol level:  Lab Results  Component Value Date   ETH 248 (H) 08/02/2023   ETH <10 10/28/2022    Metabolic Disorder Labs: No results found for: "HGBA1C", "MPG" No results found for: "PROLACTIN" No results found for: "CHOL", "TRIG", "HDL", "CHOLHDL", "VLDL", "LDLCALC"  Physical Findings: AIMS:  , ,  ,  ,    CIWA:  CIWA-Ar Total: 0 COWS:     Musculoskeletal: Strength & Muscle Tone: within normal limits Gait & Station: normal Patient leans: N/A  Psychiatric Specialty Exam:  Presentation  General Appearance:  Casual; Fairly Groomed  Eye Contact: Fair  Speech: Normal Rate  Speech Volume: Normal  Handedness: Right   Mood and Affect  Mood: Anxious  Affect: Full Range   Thought Process  Thought Processes: Linear  Descriptions of Associations:Intact  Orientation:Full (Time, Place and Person)  Thought Content:Logical  History of Schizophrenia/Schizoaffective disorder:No  Duration of Psychotic Symptoms:No data recorded Hallucinations:Hallucinations: None  Ideas of Reference:None  Suicidal Thoughts:Suicidal Thoughts: No  Homicidal Thoughts:Homicidal Thoughts: No   Sensorium  Memory: Recent Good; Remote Good; Immediate Good  Judgment: Fair  Insight: Fair   Art therapist  Concentration: Fair  Attention Span: Fair  Recall: [employer] of Knowledge: Fair  Language: Fair   Psychomotor Activity  Psychomotor  Activity: Psychomotor Activity: Normal   Assets  Assets: Communication Skills; Social Support; Desire for Improvement; Housing   Sleep  Sleep: Sleep: Fair    Physical Exam: Physical Exam Vitals reviewed.  Constitutional:      General: He is not in acute distress.    Appearance: He is normal weight. He is not toxic-appearing.  Pulmonary:     Effort: Pulmonary effort is normal. No respiratory distress.  Neurological:     Mental Status: He is alert.     Motor: No weakness.     Gait: Gait normal.  Psychiatric:        Behavior: Behavior normal.        Thought Content: Thought content normal.        Judgment: Judgment normal.    Review of Systems  Constitutional:  Negative for chills and fever.  Cardiovascular:  Negative for chest pain and palpitations.  Neurological:  Negative for dizziness, tingling, tremors and headaches.  Psychiatric/Behavioral:  Positive for substance abuse. Negative for depression, hallucinations, memory loss and suicidal ideas. The patient is nervous/anxious. The patient does not have insomnia.   All other systems reviewed and are negative.  Blood pressure 113/85, pulse 90, temperature 98.1 F (36.7 C), temperature source Oral, resp. rate 18, height 5\' 8"  (1.727 m), weight 58.2 kg, SpO2 100%. Body mass index is 19.52 kg/m.   Treatment Plan Summary: Daily contact with patient to assess and evaluate symptoms and progress in treatment and Medication management   Diagnoses / Active Problems: GAD with panic attacks History of MDD, patient not reporting symptoms consistent with a current depressive episode   Cannabis abuse, rule out cannabis use disorder Alcohol abuse, rule out alcohol use disorder Tobacco abuse   PLAN: Safety and Monitoring:             -- Involuntary admission to inpatient psychiatric unit for safety, stabilization and treatment.  Will hold IVC due to the  patient's minimizing of psychiatric symptoms and also suicidal thoughts  to ED staff, see note from K Vice on 10-18.                -- Daily contact with patient to assess and evaluate symptoms and progress in treatment             -- Patient's case to be discussed in multi-disciplinary team meeting             -- Observation Level : q15 minute checks             -- Vital signs:  q12 hours             -- Precautions: suicide, elopement, and assault   2. Psychiatric Diagnoses and Treatment:               -Continue Prozac 20 mg once daily for GAD.  This medication was started while he was in the medical hospital.    -Continue metoprolol 25 mg twice daily for tachycardia and hypertension  -Start clonidine 0.1 mg twice daily scheduled for hypertension -Start clonidine 0.1 mg as needed every 4 hours for hypertension with parameters     --  The risks/benefits/side-effects/alternatives to this medication were discussed in detail with the patient and time was given for questions. The patient consents to medication trial.                -- Metabolic profile and EKG monitoring obtained while on an atypical antipsychotic (BMI: Lipid Panel: HbgA1c: QTc:)              -- Encouraged patient to participate in unit milieu and in scheduled group therapies     3. Medical Issues Being Addressed:              Tobacco Use Disorder             -- Nicotine patch 21mg /24 hours ordered             -- Smoking cessation encouraged   4. Discharge Planning:              -- Social work and case management to assist with discharge planning and identification of hospital follow-up needs prior to discharge             -- Estimated LOS: 5-7 days             -- Discharge Concerns: Need to establish a safety plan; Medication compliance and effectiveness             -- Discharge Goals: Return home with outpatient referrals for mental health follow-up including medication management/psychotherapy    Phineas Inches, MD 08/08/2023, 11:05 AM  Total Time Spent in Direct Patient Care:   I personally spent 35 minutes on the unit in direct patient care. The direct patient care time included face-to-face time with the patient, reviewing the patient's chart, communicating with other professionals, and coordinating care. Greater than 50% of this time was spent in counseling or coordinating care with the patient regarding goals of hospitalization, psycho-education, and discharge planning needs.   Phineas Inches, MD Psychiatrist

## 2023-08-08 NOTE — Progress Notes (Signed)
   08/08/23 2100  Psych Admission Type (Psych Patients Only)  Admission Status Involuntary  Psychosocial Assessment  Patient Complaints Anxiety;Substance abuse  Eye Contact Fair  Facial Expression Anxious  Affect Appropriate to circumstance  Speech Unremarkable  Interaction Assertive  Motor Activity Restless  Appearance/Hygiene Unremarkable  Behavior Characteristics Anxious  Mood Anxious  Thought Process  Coherency WDL  Content Blaming others  Delusions None reported or observed  Perception WDL  Hallucination None reported or observed  Judgment Limited  Confusion None  Danger to Self  Current suicidal ideation? Denies (Denies)  Agreement Not to Harm Self Yes  Description of Agreement verbal  Danger to Others  Danger to Others None reported or observed

## 2023-08-09 DIAGNOSIS — F411 Generalized anxiety disorder: Secondary | ICD-10-CM

## 2023-08-09 MED ORDER — TRAZODONE HCL 50 MG PO TABS: 50.0000 mg | ORAL_TABLET | Freq: Every evening | ORAL | 0 refills | Status: AC | PRN

## 2023-08-09 MED ORDER — CLONIDINE HCL 0.1 MG PO TABS: 0.1000 mg | ORAL_TABLET | Freq: Two times a day (BID) | ORAL | 0 refills | Status: AC

## 2023-08-09 MED ORDER — FLUOXETINE HCL 20 MG PO CAPS: 20.0000 mg | ORAL_CAPSULE | Freq: Every day | ORAL | 0 refills | Status: AC

## 2023-08-09 MED ORDER — METOPROLOL TARTRATE 25 MG PO TABS: 25.0000 mg | ORAL_TABLET | Freq: Two times a day (BID) | ORAL | 0 refills | Status: AC

## 2023-08-09 NOTE — Group Note (Signed)
Date:  08/09/2023 Time:  1:00 PM  Group Topic/Focus:  Goals Group:   The focus of this group is to help patients establish daily goals to achieve during treatment and discuss how the patient can incorporate goal setting into their daily lives to aide in recovery.    Participation Level:  Active  Participation Quality:  Appropriate  Affect:  Appropriate  Cognitive:  Alert  Insight: Good  Engagement in Group:  Engaged  Modes of Intervention:  Discussion  Additional Comments:  NA  Beckie Busing 08/09/2023, 1:00 PM

## 2023-08-09 NOTE — Group Note (Signed)
Date:  08/09/2023 Time:  1:16 PM  Group Topic/Focus:  Developing a Wellness Toolbox:   The focus of this group is to help patients develop a "wellness toolbox" with skills and strategies to promote recovery upon discharge.    Participation Level:  Active  Participation Quality:  Appropriate  Affect:  Appropriate  Cognitive:  Appropriate  Insight: Good  Engagement in Group:  Engaged  Modes of Intervention:  Education  Additional Comments:    Beckie Busing 08/09/2023, 1:16 PM

## 2023-08-09 NOTE — Progress Notes (Addendum)
  [facility] Adult Case Management Discharge Plan :  Will you be returning to the same living situation after discharge:  Yes,  Patient will be returning back home with wife  At discharge, do you have transportation home?: Yes,  Wife will be here at 1:00 PM Do you have the ability to pay for your medications: No. CSW did provide patient with Pharmacy saving cards to assist with getting his medication   Release of information consent forms completed and in the chart;  Patient's signature needed at discharge.  Patient to Follow up at: PLEASE GO TO [facility] ON [facility] 08/12/2023 @ 7:00 AM to 2ND FLOOR for faster services with getting an early appt for medication management and therapy services ; see instructions below   Follow-up Information     [facility]. Go to.   Specialty: Behavioral Health Why: For fastest service, please go to this provider for an assessment, to obtain therapy and medication management at 7:00 am, Monday through Friday. At this time the earliest appt they have for medication management is Dec. 9th 2024 @ 1:00pm Contact information: [address] [place] [address] [phone]                Next level of care provider has access to [facility] Link:yes  Safety Planning and Suicide Prevention discussed: Yes,  Barbaraann Boys (Fiance) [phone]     Has patient been referred to the Quitline?: Patient refused referral for treatment  Patient has been referred for addiction treatment: No known substance use disorder. Patient does drink beer and smokes marijuana   Isabella Bowens, LCSWA 08/09/2023, 10:27 AM

## 2023-08-09 NOTE — Group Note (Signed)
Recreation Therapy Group Note   Group Topic:Team Building  Group Date: 08/09/2023 Start Time: 0935 End Time: 0955 Facilitators: Haley Roza-McCall, LRT,CTRS Location: [address] Day[address]   Group Topic: Team Building, Problem Solving  Goal Area(s) Addresses:  Patient will effectively work with peer towards shared goal.  Patient will identify skills used to make activity successful.  Patient will identify how skills used during activity can be applied to reach post d/c goals.   Intervention: STEM Activity- Glass blower/designer  Group Description: Tallest Pharmacist, community. In teams of 5-6, patients were given 11 craft pipe cleaners. Using the materials provided, patients were instructed to compete again the opposing team(s) to build the tallest free-standing structure from floor level. The activity was timed; difficulty increased by Clinical research associate as Production designer, theatre/television/film continued.  Systematically resources were removed with additional directions for example, placing one arm behind their back, working in silence, and shape stipulations. LRT facilitated post-activity discussion reviewing team processes and necessary communication skills involved in completion. Patients were encouraged to reflect how the skills utilized, or not utilized, in this activity can be incorporated to positively impact support systems post discharge.  Education: Pharmacist, community, Scientist, physiological, Discharge Planning   Education Outcome: Acknowledges education/In group clarification offered/Needs additional education.    Affect/Mood: Appropriate   Participation Level: Engaged   Participation Quality: Independent   Behavior: Appropriate   Speech/Thought Process: Focused   Insight: Good   Judgement: Good   Modes of Intervention: STEM Activity   Patient Response to Interventions:  Engaged   Education Outcome:  In group clarification offered    Clinical Observations/Individualized Feedback: Pt attended and  participated in group session.      Plan: Continue to engage patient in RT group sessions 2-3x/week.   Isidro Monks-McCall, LRT,CTRS 08/09/2023 12:15 PM

## 2023-08-09 NOTE — BHH Suicide Risk Assessment (Signed)
[facility] Discharge Suicide Risk Assessment   Principal Problem: GAD (generalized anxiety disorder) Discharge Diagnoses: Principal Problem:   GAD (generalized anxiety disorder) Active Problems:   Seizure-like activity (HCC)   Severe episode of recurrent major depressive disorder, without psychotic features (HCC)   Cannabis abuse   Tobacco abuse   Alcohol abuse   Total Time spent with patient: 30 minutes  Patient was admitted on 08/07/2023 due to suicidal ideation and thoughts of cutting himself. He contacted his significant other and she contacted the police to bring him to the hospital. He reports that he did not have any plans of hurting himself and that he was just overwhelmed with work. While in the hospital he was stabilized with Prozac 20 mg daily.   On day of discharge he denied any SI/HI/AVH. He reported that he had figured out a more reasonable work schedule and he felt safe going home. The patient's significant other Barbaraann Boys [phone]) who he lives with was contacted and she stated that she only wanted him monitored for 24 hours and she felt comfortable with him coming home today.  Psychiatric Specialty Exam  Presentation  General Appearance:  Appropriate for Environment  Eye Contact: Good  Speech: Clear and Coherent  Speech Volume: Normal  Handedness: Right   Mood and Affect  Mood: Euthymic  Duration of Depression Symptoms: Greater than two weeks  Affect: Appropriate   Thought Process  Thought Processes: Coherent  Descriptions of Associations:Intact  Orientation:Full (Time, Place and Person)  Thought Content:Logical  History of Schizophrenia/Schizoaffective disorder:No  Duration of Psychotic Symptoms:No data recorded Hallucinations:Hallucinations: None  Ideas of Reference:None  Suicidal Thoughts:Suicidal Thoughts: No  Homicidal Thoughts:Homicidal Thoughts: No   Sensorium  Memory: Immediate Good; Remote  Good  Judgment: Good  Insight: Good   Executive Functions  Concentration: Good  Attention Span: Good  Recall: Good  Fund of Knowledge: Good  Language: Good   Psychomotor Activity  Psychomotor Activity:No data recorded  Assets  Assets: Communication Skills; Social Support; Desire for Improvement; Housing   Sleep  Sleep: Sleep: Good   Physical Exam: Physical Exam ROS Blood pressure 107/80, pulse 79, temperature 98 F (36.7 C), temperature source Oral, resp. rate 18, height 5\' 8"  (1.727 m), weight 58.2 kg, SpO2 100%. Body mass index is 19.52 kg/m.  Mental Status Per Nursing Assessment::   On Admission:  NA  Demographic Factors:  Male  Loss Factors: Financial problems/change in socioeconomic status  Historical Factors: NA  Risk Reduction Factors:   Employed, Living with another person, especially a relative, and Positive social support  Continued Clinical Symptoms:  NA  Cognitive Features That Contribute To Risk:  None    Suicide Risk:  Mild:  Suicidal ideation of limited frequency, intensity, duration, and specificity.  There are no identifiable plans, no associated intent, mild dysphoria and related symptoms, good self-control (both objective and subjective assessment), few other risk factors, and identifiable protective factors, including available and accessible social support.   Follow-up Information     [facility]. Go to.   Specialty: Behavioral Health Why: For fastest service, please go to this provider for an assessment, to obtain therapy and medication management at 7:00 am, Monday through Friday. At this time the earliest appt they have for medication management is Dec. 9th 2024 @ 1:00pm Contact information: [address] [place] [address] [phone]                Plan Of Care/Follow-up recommendations:  Activity:  as  tolerated  Harlin Heys, DO 08/09/2023, 10:44 AM

## 2023-08-09 NOTE — Plan of Care (Signed)
  Problem: Education: Goal: Knowledge of [facility] Education information/materials will improve Outcome: Progressing Goal: Emotional status will improve Outcome: Progressing Goal: Mental status will improve Outcome: Progressing Goal: Verbalization of understanding the information provided will improve Outcome: Progressing   Problem: Activity: Goal: Interest or engagement in activities will improve Outcome: Progressing Goal: Sleeping patterns will improve Outcome: Progressing   Problem: Coping: Goal: Ability to verbalize frustrations and anger appropriately will improve Outcome: Progressing Goal: Ability to demonstrate self-control will improve Outcome: Progressing   Problem: Health Behavior/Discharge Planning: Goal: Identification of resources available to assist in meeting health care needs will improve Outcome: Progressing Goal: Compliance with treatment plan for underlying cause of condition will improve Outcome: Progressing   Problem: Physical Regulation: Goal: Ability to maintain clinical measurements within normal limits will improve Outcome: Progressing   Problem: Safety: Goal: Periods of time without injury will increase Outcome: Progressing   Problem: Education: Goal: Knowledge of [facility] Education information/materials will improve Outcome: Progressing Goal: Emotional status will improve Outcome: Progressing Goal: Mental status will improve Outcome: Progressing Goal: Verbalization of understanding the information provided will improve Outcome: Progressing   Problem: Activity: Goal: Interest or engagement in activities will improve Outcome: Progressing Goal: Sleeping patterns will improve Outcome: Progressing   Problem: Education: Goal: Ability to state activities that reduce stress will improve Outcome: Progressing

## 2023-08-09 NOTE — Progress Notes (Signed)
I attempted to call the patient's fianc, Abran Duke, in order to confirm details of the HPI to discuss potential risk discharge. I left a voicemail.  Prior to discharge, I recommend a psychiatric team getting in touch with Abran Duke. I discuss with the patient on Thursday, we are considering discharge as early as Friday.

## 2023-08-09 NOTE — Progress Notes (Signed)
Patient discharged to home accompanied by friend. Discharge instructions, prescriptions, all required discharge documents and information about follow-up appointment given to pt with verbalization of understanding. All personal belongings returned to pt at time of discharge. Plan of Care resolved. Pt denied SI/HI and AVH at time of discharge. Pt escorted to lobby by RN at 1338.  08/09/23 0818  Psych Admission Type (Psych Patients Only)  Admission Status Involuntary  Psychosocial Assessment  Patient Complaints Anxiety  Eye Contact Fair  Facial Expression Anxious  Affect Appropriate to circumstance  Speech Unremarkable  Interaction Assertive  Motor Activity Fidgety  Appearance/Hygiene Unremarkable  Behavior Characteristics Cooperative;Appropriate to situation  Mood Anxious  Thought Process  Coherency WDL  Content WDL  Delusions None reported or observed  Perception WDL  Hallucination None reported or observed  Judgment Impaired  Confusion None  Danger to Self  Current suicidal ideation? Denies  Agreement Not to Harm Self Yes  Description of Agreement Verbal  Danger to Others  Danger to Others None reported or observed

## 2023-08-09 NOTE — Discharge Summary (Signed)
Physician Discharge Summary Note  Patient:  Stephen [name] is an 32 y.o., male MRN:  [id number] DOB:  10-Jun-1991 Patient phone:  [phone] (home)  Patient address:   [address] [place] [address],  Total Time spent with patient: 30 minutes  Date of Admission:  08/06/2023 Date of Discharge: 08/09/23   Reason for Admission:  Patient is a 32 year old male with a reported psychiatric history of major depressive disorder and manic depression, who was admitted to the psychiatric hospital from the medical hospital for worsening anxiety, panic attacks, and suicidal thoughts. Patient was initially admitted to the medical hospital for seizure-like activity.   Principal Problem: GAD (generalized anxiety disorder) Discharge Diagnoses: Principal Problem:   GAD (generalized anxiety disorder) Active Problems:   Seizure-like activity (HCC)   Severe episode of recurrent major depressive disorder, without psychotic features (HCC)   Cannabis abuse   Tobacco abuse   Alcohol abuse   Past Psychiatric History: The patient reports previously been diagnosed with both major depressive disorder and manic depression. He reports no previous diagnosis of anxiety disorder. Reports using marijuana to self-medicate anxiety. Denies any previous psychiatric hospitalizations or suicide attempts. Reports taking Prozac in the past, about 15 years ago. Reports no current outpatient psychiatric medication prescribing.   Past Medical History:  Past Medical History:  Diagnosis Date   Depression    Seizures (HCC)     Past Surgical History:  Procedure Laterality Date   WISDOM TOOTH EXTRACTION     Family History: History reviewed. No pertinent family history. Family Psychiatric  History: Denies any known diagnosis of psych history in the family but does report that one of his mother's family members ?aunt completed suicide.  Social History:  Social History   Substance and Sexual Activity  Alcohol Use  Yes   Alcohol/week: 3.0 standard drinks of alcohol   Types: 3 Cans of beer per week     Social History   Substance and Sexual Activity  Drug Use Yes   Types: Marijuana    Social History   Socioeconomic History   Marital status: Single    Spouse name: Not on file   Number of children: Not on file   Years of education: Not on file   Highest education level: Not on file  Occupational History   Not on file  Tobacco Use   Smoking status: Every Day    Types: Cigars   Smokeless tobacco: Never  Vaping Use   Vaping status: Never Used  Substance and Sexual Activity   Alcohol use: Yes    Alcohol/week: 3.0 standard drinks of alcohol    Types: 3 Cans of beer per week   Drug use: Yes    Types: Marijuana   Sexual activity: Not on file  Other Topics Concern   Not on file  Social History Narrative   Not on file   Social Determinants of Health   Financial Resource Strain: Not on file  Food Insecurity: No Food Insecurity (08/06/2023)   Hunger Vital Sign    Worried About Running Out of Food in the Last Year: Never true    Ran Out of Food in the Last Year: Never true  Transportation Needs: No Transportation Needs (08/06/2023)   PRAPARE - Administrator, Civil Service (Medical): No    Lack of Transportation (Non-Medical): No  Physical Activity: Not on file  Stress: Not on file  Social Connections: Not on file    Hospital Course:   Patient was admitted  on 08/07/2023 due to suicidal ideation and thoughts of cutting himself. He contacted his significant other and she contacted the police to bring him to the hospital. He reports that he did not have any plans of hurting himself and that he was just overwhelmed with work. While in the hospital he was stabilized with Prozac 20 mg daily.   On day of discharge he denied any SI/HI/AVH. He reported that he had figured out a more reasonable work schedule and he felt safe going home. The patient's significant other Barbaraann Boys [phone]) who he lives with was contacted and she stated that she only wanted him monitored for 24 hours and she felt comfortable with him coming home today.  Physical Findings: AIMS:  , ,  ,  ,    CIWA:  CIWA-Ar Total: 3 COWS:     Psychiatric Specialty Exam:  Presentation  General Appearance:  Appropriate for Environment  Eye Contact: Good  Speech: Clear and Coherent  Speech Volume: Normal  Handedness: Right   Mood and Affect  Mood: Euthymic  Affect: Appropriate   Thought Process  Thought Processes: Coherent  Descriptions of Associations:Intact  Orientation:Full (Time, Place and Person)  Thought Content:Logical  History of Schizophrenia/Schizoaffective disorder:No  Duration of Psychotic Symptoms:No data recorded Hallucinations:Hallucinations: None  Ideas of Reference:None  Suicidal Thoughts:Suicidal Thoughts: No  Homicidal Thoughts:Homicidal Thoughts: No   Sensorium  Memory: Immediate Good; Remote Good  Judgment: Good  Insight: Good   Executive Functions  Concentration: Good  Attention Span: Good  Recall: Good  Fund of Knowledge: Good  Language: Good   Psychomotor Activity  Psychomotor Activity:No data recorded  Assets  Assets: Communication Skills; Social Support; Desire for Improvement; Housing   Sleep  Sleep: Sleep: Good    Physical Exam: Physical Exam ROS Blood pressure 107/80, pulse 79, temperature 98 F (36.7 C), temperature source Oral, resp. rate 18, height 5\' 8"  (1.727 m), weight 58.2 kg, SpO2 100%. Body mass index is 19.52 kg/m.   Social History   Tobacco Use  Smoking Status Every Day   Types: Cigars  Smokeless Tobacco Never   Tobacco Cessation:  N/A, patient does not currently use tobacco products   Blood Alcohol level:  Lab Results  Component Value Date   ETH 248 (H) 08/02/2023   ETH <10 10/28/2022    Metabolic Disorder Labs:  No results found for: "HGBA1C", "MPG" No  results found for: "PROLACTIN" No results found for: "CHOL", "TRIG", "HDL", "CHOLHDL", "VLDL", "LDLCALC"  See Psychiatric Specialty Exam and Suicide Risk Assessment completed by Attending Physician prior to discharge.  Discharge destination:  Home  Is patient on multiple antipsychotic therapies at discharge:  No   Has Patient had three or more failed trials of antipsychotic monotherapy by history:  No  Recommended Plan for Multiple Antipsychotic Therapies: NA  Discharge Instructions     Diet - low sodium heart healthy   Complete by: As directed    Increase activity slowly   Complete by: As directed       Allergies as of 08/09/2023   No Known Allergies    No current facility-administered medications on file prior to encounter.   Current Outpatient Medications on File Prior to Encounter  Medication Sig Dispense Refill   FLUoxetine (PROZAC) 10 MG capsule Take 1 capsule (10 mg total) by mouth daily.     folic acid (FOLVITE) 1 MG tablet Take 1 tablet (1 mg total) by mouth daily.     metoprolol tartrate (LOPRESSOR) 25 MG tablet  Take 0.5 tablets (12.5 mg total) by mouth 2 (two) times daily.     Multiple Vitamin (MULTIVITAMIN WITH MINERALS) TABS tablet Take 1 tablet by mouth daily.     nicotine (NICODERM CQ - DOSED IN MG/24 HOURS) 14 mg/24hr patch Place 1 patch (14 mg total) onto the skin daily.     ondansetron (ZOFRAN) 4 MG tablet Take 1 tablet (4 mg total) by mouth every 6 (six) hours as needed for nausea.     potassium chloride (KLOR-CON) 10 MEQ tablet Take 2 tablets (20 mEq total) by mouth daily for 7 days. (Patient not taking: Reported on 08/02/2023) 14 tablet 0   thiamine (VITAMIN B-1) 100 MG tablet Take 1 tablet (100 mg total) by mouth daily.           Follow-up Information     [facility]. Go to.   Specialty: Behavioral Health Why: For fastest service, please go to this provider for an assessment, to obtain therapy and medication management  at 7:00 am, Monday through Friday. At this time the earliest appt they have for medication management is Dec. 9th 2024 @ 1:00pm Contact information: [address] [place] [address] [phone]                Follow-up recommendations:  Activity:  as tolerated  Comments:  Please call [phone] if symptoms represent after discharge  Signed: Harlin Heys, DO 08/09/2023, 10:46 AM

## 2023-08-09 NOTE — Group Note (Signed)
Date:  08/09/2023 Time:  9:23 AM  Group Topic/Focus:  Orientation:   The focus of this group is to educate the patient on the purpose and policies of crisis stabilization and provide a format to answer questions about their admission.  The group details unit policies and expectations of patients while admitted.    Participation Level:  Active  Participation Quality:  Appropriate  Affect:  Appropriate  Cognitive:  Alert  Insight: Appropriate  Engagement in Group:  Engaged  Modes of Intervention:  Orientation  Additional Comments:    Beckie Busing 08/09/2023, 9:23 AM

## 2023-09-23 ENCOUNTER — Ambulatory Visit (HOSPITAL_COMMUNITY): Payer: No Payment, Other | Admitting: Psychiatry
